# Patient Record
Sex: Female | Born: 1975 | Race: White | Hispanic: No | Marital: Married | Smoking: Never smoker
Health system: Southern US, Community
[De-identification: ages and names within clinical notes are randomized; demographics above are authoritative.]

## PROBLEM LIST (undated history)

## (undated) DIAGNOSIS — F419 Anxiety disorder, unspecified: Secondary | ICD-10-CM

## (undated) DIAGNOSIS — J45909 Unspecified asthma, uncomplicated: Secondary | ICD-10-CM

## (undated) DIAGNOSIS — I10 Essential (primary) hypertension: Secondary | ICD-10-CM

## (undated) DIAGNOSIS — G43909 Migraine, unspecified, not intractable, without status migrainosus: Secondary | ICD-10-CM

## (undated) HISTORY — DX: Essential (primary) hypertension: I10

## (undated) HISTORY — PX: WISDOM TOOTH EXTRACTION: SHX21

## (undated) HISTORY — DX: Anxiety disorder, unspecified: F41.9

---

## 2008-06-06 ENCOUNTER — Ambulatory Visit: Payer: Self-pay | Admitting: Family Medicine

## 2008-06-06 DIAGNOSIS — R519 Headache, unspecified: Secondary | ICD-10-CM | POA: Insufficient documentation

## 2008-06-06 DIAGNOSIS — R51 Headache: Secondary | ICD-10-CM

## 2008-06-06 DIAGNOSIS — J209 Acute bronchitis, unspecified: Secondary | ICD-10-CM | POA: Insufficient documentation

## 2008-06-06 DIAGNOSIS — F411 Generalized anxiety disorder: Secondary | ICD-10-CM | POA: Insufficient documentation

## 2008-06-06 DIAGNOSIS — J45909 Unspecified asthma, uncomplicated: Secondary | ICD-10-CM | POA: Insufficient documentation

## 2008-06-06 DIAGNOSIS — J309 Allergic rhinitis, unspecified: Secondary | ICD-10-CM | POA: Insufficient documentation

## 2010-03-12 ENCOUNTER — Ambulatory Visit (INDEPENDENT_AMBULATORY_CARE_PROVIDER_SITE_OTHER): Payer: BC Managed Care – PPO | Admitting: Emergency Medicine

## 2010-03-12 ENCOUNTER — Encounter: Payer: Self-pay | Admitting: Emergency Medicine

## 2010-03-12 DIAGNOSIS — J209 Acute bronchitis, unspecified: Secondary | ICD-10-CM

## 2010-03-12 DIAGNOSIS — J45909 Unspecified asthma, uncomplicated: Secondary | ICD-10-CM

## 2010-03-12 DIAGNOSIS — Z803 Family history of malignant neoplasm of breast: Secondary | ICD-10-CM

## 2010-03-18 ENCOUNTER — Telehealth (INDEPENDENT_AMBULATORY_CARE_PROVIDER_SITE_OTHER): Payer: Self-pay | Admitting: *Deleted

## 2010-03-19 NOTE — Assessment & Plan Note (Signed)
Summary: COUGHING/CHEST CONGESTION rm 4   Vital Signs:  Patient Profile:   35 Years Old Female CC:      cough, SOB Height:     63 inches O2 Sat:      100 % O2 treatment:    Room Air Temp:     99.1 degrees F oral Pulse rate:   99 / minute Resp:     24 per minute BP sitting:   125 / 76  (left arm) Cuff size:   regular  Vitals Entered By: Clemens Catholic LPN (March 11, 8117 4:52 PM)                  Updated Prior Medication List: PROAIR HFA 108 (90 BASE) MCG/ACT AERS (ALBUTEROL SULFATE) Two inhalations q4-6hr as needed.  Max 12 puffs/day  Current Allergies (reviewed today): ! VICODINHistory of Present Illness History from: patient Chief Complaint: cough, SOB History of Present Illness: 35 Years Old Female complains of onset of cold symptoms for 4 days.  BALINDA has been using Albuterol, Robitussin, Hydrocodone cough syrup which is helping a little bit. No sore throat + cough No pleuritic pain + wheezing (asthma) No nasal congestion + post-nasal drainage No sinus pain/pressure + chest congestion No itchy/red eyes No earache No hemoptysis + SOB (asthma) No chills/sweats No fever No nausea No vomiting No abdominal pain No diarrhea No skin rashes No fatigue No myalgias No headache   REVIEW OF SYSTEMS Constitutional Symptoms      Denies fever, chills, night sweats, weight loss, weight gain, and fatigue.  Eyes       Denies change in vision, eye pain, eye discharge, glasses, contact lenses, and eye surgery. Ear/Nose/Throat/Mouth       Complains of sore throat and hoarseness.      Denies hearing loss/aids, change in hearing, ear pain, ear discharge, dizziness, frequent runny nose, frequent nose bleeds, sinus problems, and tooth pain or bleeding.  Respiratory       Complains of dry cough, wheezing, shortness of breath, and asthma.      Denies productive cough, bronchitis, and emphysema/COPD.  Cardiovascular       Denies murmurs, chest pain, and tires easily with  exhertion.    Gastrointestinal       Denies stomach pain, nausea/vomiting, diarrhea, constipation, blood in bowel movements, and indigestion. Genitourniary       Denies painful urination, kidney stones, and loss of urinary control. Neurological       Complains of headaches.      Denies paralysis, seizures, and fainting/blackouts. Musculoskeletal       Denies muscle pain, joint pain, joint stiffness, decreased range of motion, redness, swelling, muscle weakness, and gout.  Skin       Denies bruising, unusual mles/lumps or sores, and hair/skin or nail changes.  Psych       Denies mood changes, temper/anger issues, anxiety/stress, speech problems, depression, and sleep problems. Other Comments: pt c/o dry cough, SOB, x friday. she has a hx of asthma. she has taken hydrocodone cough syrup, Tussin cold and cough, Sudafed, and Albuterol inh.    Past History:  Past Medical History: Reviewed history from 06/06/2008 and no changes required. Allergic rhinitis Anxiety Asthma -borderline Headache  Past Surgical History: wisdom teeth removed  Family History: Auto immune -  Family History Breast cancer 1st degree relative <50 Family History Diabetes 1st degree relative Family History Ovarian cancer heart dz  Social History: Reviewed history from 06/06/2008 and no changes required. Married Never  Smoked Alcohol use-yes, rarely Drug use-no Regular exercise-yes Physical Exam General appearance: well developed, well nourished, mild distress Ears: normal, no lesions or deformities Nasal: mucosa pink, nonedematous, no septal deviation, turbinates normal Oral/Pharynx: tongue normal, posterior pharynx without erythema or exudate Chest/Lungs: scattered wheezes throughout all lobes (no wheezes s/p neb treatment) Heart: regular rate and  rhythm, no murmur MSE: oriented to time, place, and person Assessment New Problems: BRONCHITIS, ACUTE WITH MILD BRONCHOSPASM (ICD-466.0)   Plan New  Medications/Changes: VENTOLIN HFA 108 (90 BASE) MCG/ACT AERS (ALBUTEROL SULFATE) 1-2 puffs q6 hrs as needed  #1 x 3, 03/12/2010, Hoyt Koch MD PREDNISONE (PAK) 10 MG TABS (PREDNISONE) 6 day pack, use as directed  #1 x 0, 03/12/2010, Hoyt Koch MD  New Orders: Est. Patient Level IV [16109] Pulse Oximetry (single measurment) [94760] Nebulizer Tx [94640] Ipratropium inhalation sol. unit dose [U0454] Albuterol Sulfate Sol 1mg  unit dose [J7613] Solumedrol up to 125mg  [J2930] Admin of Therapeutic Inj  intramuscular or subcutaneous [96372] Planning Comments:   1)  No antibiotic since likely viral bronchitis.  If not improved in a few days, patient to call back and we will call in Zpak. 2)  Use nasal saline solution (over the counter) at least 3 times a day. 3)  Use over the counter decongestants like Zyrtec-D every 12 hours as needed to help with congestion.  Can use your cough syrup at night, but likely won't help much since coughing is from bronchospasm. 4)  Can take tylenol every 6 hours or motrin every 8 hours for pain or fever. 5)  Follow up with your primary doctor  if no improvement in 5-7 days, sooner if increasing pain, fever, or new symptoms.    The patient and/or caregiver has been counseled thoroughly with regard to medications prescribed including dosage, schedule, interactions, rationale for use, and possible side effects and they verbalize understanding.  Diagnoses and expected course of recovery discussed and will return if not improved as expected or if the condition worsens. Patient and/or caregiver verbalized understanding.  Prescriptions: VENTOLIN HFA 108 (90 BASE) MCG/ACT AERS (ALBUTEROL SULFATE) 1-2 puffs q6 hrs as needed  #1 x 3   Entered and Authorized by:   Hoyt Koch MD   Signed by:   Hoyt Koch MD on 03/12/2010   Method used:   Print then Give to Patient   RxID:   [id number] PREDNISONE (PAK) 10 MG TABS (PREDNISONE) 6 day pack, use as  directed  #1 x 0   Entered and Authorized by:   Hoyt Koch MD   Signed by:   Hoyt Koch MD on 03/12/2010   Method used:   Print then Give to Patient   RxID:   [id number]   Medication Administration  Injection # 1:    Medication: Solumedrol up to 125mg     Diagnosis: BRONCHITIS, ACUTE WITH MILD BRONCHOSPASM (ICD-466.0)    Route: IM    Site: RUOQ gluteus    Exp Date: 07/06/2012    Lot #: [id number]    Mfr: Pharmacia    Patient tolerated injection without complications    Given by: Clemens Catholic LPN (March 12, 4130 5:30 PM)  Medication # 1:    Medication: Albuterol Sulfate Sol 1mg  unit dose    Diagnosis: BRONCHITIS, ACUTE WITH MILD BRONCHOSPASM (ICD-466.0)    Dose: 3.0mg      Route: inhaled    Exp Date: 11/07/2010    Lot #: [id number]    Mfr: mylan    Patient tolerated medication without complications  Given by: Clemens Catholic LPN (March 11, 9145 5:07 PM)  Medication # 2:    Medication: Ipratropium inhalation sol. unit dose    Diagnosis: BRONCHITIS, ACUTE WITH MILD BRONCHOSPASM (ICD-466.0)    Dose: 0.5mg     Route: inhaled    Exp Date: 11/07/2010    Lot #: [id number]    Mfr: mylan    Patient tolerated medication without complications    Given by: Clemens Catholic LPN (March 11, 8293 5:08 PM)  Orders Added: 1)  Est. Patient Level IV [62130] 2)  Pulse Oximetry (single measurment) [94760] 3)  Nebulizer Tx [86578] 4)  Ipratropium inhalation sol. unit dose [J7644] 5)  Albuterol Sulfate Sol 1mg  unit dose [J7613] 6)  Solumedrol up to 125mg  [J2930] 7)  Admin of Therapeutic Inj  intramuscular or subcutaneous [46962]

## 2010-03-26 NOTE — Progress Notes (Signed)
  Phone Note Outgoing Call Call back at [facility]   Summary of Call: Patient called reporting she is feeling wose. Per note z-pak called in to [facility] in [place]. Initial call taken by: Lajean Saver RN,  March 18, 2010 8:30 AM

## 2010-04-24 ENCOUNTER — Other Ambulatory Visit: Payer: Self-pay | Admitting: Specialist

## 2010-04-24 DIAGNOSIS — N644 Mastodynia: Secondary | ICD-10-CM

## 2011-01-08 ENCOUNTER — Other Ambulatory Visit: Payer: Self-pay | Admitting: Family Medicine

## 2011-01-08 ENCOUNTER — Ambulatory Visit
Admission: RE | Admit: 2011-01-08 | Discharge: 2011-01-08 | Disposition: A | Discharge status: Home / Self Care | Payer: BC Managed Care – PPO | Source: Ambulatory Visit | Attending: Family Medicine | Admitting: Family Medicine

## 2011-01-08 DIAGNOSIS — R05 Cough: Secondary | ICD-10-CM

## 2011-01-08 DIAGNOSIS — R0602 Shortness of breath: Secondary | ICD-10-CM

## 2012-01-07 ENCOUNTER — Encounter: Payer: Self-pay | Admitting: *Deleted

## 2012-01-07 ENCOUNTER — Emergency Department
Admission: EM | Admit: 2012-01-07 | Discharge: 2012-01-07 | Disposition: A | Discharge status: Home / Self Care | Payer: BC Managed Care – PPO | Source: Home / Self Care | Attending: Family Medicine | Admitting: Family Medicine

## 2012-01-07 DIAGNOSIS — J069 Acute upper respiratory infection, unspecified: Secondary | ICD-10-CM

## 2012-01-07 HISTORY — DX: Migraine, unspecified, not intractable, without status migrainosus: G43.909

## 2012-01-07 HISTORY — DX: Unspecified asthma, uncomplicated: J45.909

## 2012-01-07 MED ORDER — PREDNISONE 20 MG PO TABS: 20.0000 mg | ORAL_TABLET | Freq: Two times a day (BID) | ORAL | Status: DC

## 2012-01-07 MED ORDER — ALBUTEROL SULFATE HFA 108 (90 BASE) MCG/ACT IN AERS: 2.0000 | INHALATION_SPRAY | Freq: Four times a day (QID) | RESPIRATORY_TRACT | Status: AC | PRN

## 2012-01-07 MED ORDER — AZITHROMYCIN 250 MG PO TABS: ORAL_TABLET | ORAL | Status: DC

## 2012-01-07 MED ORDER — BENZONATATE 200 MG PO CAPS: 200.0000 mg | ORAL_CAPSULE | Freq: Every day | ORAL | Status: DC

## 2012-01-07 NOTE — ED Provider Notes (Signed)
History     CSN: [id number]  Arrival date & time 01/07/12  1342   First MD Initiated Contact with Patient 01/07/12 1403      Chief Complaint  Patient presents with  . Cough      HPI Comments: Patient complains of dry cough, runny nose, and dry throat for 5 days. denies fever.  She has had wheezing improved with her Ventolin inhaler.  The history is provided by the patient.    Past Medical History  Diagnosis Date  . Asthma   . Migraine     Past Surgical History  Procedure Date  . Wisdom tooth extraction     Family History  Problem Relation Age of Onset  . Cancer Mother     breast  . Cancer Other     ovarian    History  Substance Use Topics  . Smoking status: Never Smoker   . Smokeless tobacco: Not on file  . Alcohol Use: No    OB History    Grav Para Term Preterm Abortions TAB SAB Ect Mult Living                  Review of Systems + sore throat + cough No pleuritic pain + wheezing + nasal congestion + post-nasal drainage No sinus pain/pressure No itchy/red eyes ? earache No hemoptysis + SOB No fever/chills No nausea No vomiting No abdominal pain No diarrhea No urinary symptoms No skin rashes + fatigue No myalgias No headache Used OTC meds without relief  Allergies  Hydrocodone-acetaminophen  Home Medications   Current Outpatient Rx  Name  Route  Sig  Dispense  Refill  . HYDROCODONE-ACETAMINOPHEN 5-500 MG PO TABS   Oral   Take 1 tablet by mouth every 6 (six) hours as needed.         . ALBUTEROL SULFATE HFA 108 (90 BASE) MCG/ACT IN AERS   Inhalation   Inhale 2 puffs into the lungs every 6 (six) hours as needed for wheezing.   1 Inhaler   1   . AZITHROMYCIN 250 MG PO TABS      Take 2 tabs today; then begin one tab once daily for 4 more days. (Rx void after 01/14/12)   6 each   0   . BENZONATATE 200 MG PO CAPS   Oral   Take 1 capsule (200 mg total) by mouth at bedtime. Take as needed for cough   12 capsule   0   .  PREDNISONE 20 MG PO TABS   Oral   Take 1 tablet (20 mg total) by mouth 2 (two) times daily.   10 tablet   0     BP 142/87  Pulse 92  Temp 97.8 F (36.6 C) (Oral)  Resp 16  Ht 5\' 3"  (1.6 m)  Wt 188 lb (85.276 kg)  BMI 33.30 kg/m2  SpO2 98%  LMP 12/29/2011  Physical Exam Nursing notes and Vital Signs reviewed. Appearance:  Patient appears stated age, and in no acute distress.  Patient is obese (BMI 33.3) Eyes:  Pupils are equal, round, and reactive to light and accomodation.  Extraocular movement is intact.  Conjunctivae are not inflamed  Ears:  Canals normal.  Tympanic membranes normal.  Nose:  Mildly congested turbinates.  No sinus tenderness.    Pharynx:  Normal Neck:  Supple.  Slightly tender shotty posterior nodes are palpated bilaterally  Lungs:   Few scattered faint wheezes posteriorly.  Breath sounds are equal.  Heart:  Regular  rate and rhythm without murmurs, rubs, or gallops.  Abdomen:  Nontender without masses or hepatosplenomegaly.  Bowel sounds are present.  No CVA or flank tenderness.  Extremities:  No edema.  No calf tenderness Skin:  No rash present.   ED Course  Procedures none      1. Acute upper respiratory infections of unspecified site       MDM  There is no evidence of bacterial infection today.   Begin prednisone burst.  Prescription written for Benzonatate (Tessalon) to take at bedtime for night-time cough.   Continue albuterol inhaler. Take plain Mucinex (guaifenesin) twice daily (or other guaifenesin product) for cough and congestion. May add Sudafed as needed  Increase fluid intake, rest. May use Afrin nasal spray (or generic oxymetazoline) twice daily for about 5 days.  Also recommend using saline nasal spray several times daily and saline nasal irrigation (AYR is a common brand) Stop all antihistamines for now, and other non-prescription cough/cold preparations. Begin Azithromycin if not improving about 5 days or if persistent fever  develops (Given a prescription to hold, with an expiration date)  Follow-up with family doctor if not improving 7 to 10 days.        Lattie Haw, MD 01/12/12 [phone]

## 2012-01-07 NOTE — ED Notes (Signed)
Pt c/o dry cough, runny nose, and dry throat x 5 days. denies fever. She has taken benadryl, Robt, and Sudafed.

## 2013-06-07 ENCOUNTER — Other Ambulatory Visit: Payer: Self-pay | Admitting: *Deleted

## 2013-06-07 ENCOUNTER — Ambulatory Visit (INDEPENDENT_AMBULATORY_CARE_PROVIDER_SITE_OTHER): Payer: BC Managed Care – PPO

## 2013-06-07 DIAGNOSIS — R29898 Other symptoms and signs involving the musculoskeletal system: Secondary | ICD-10-CM

## 2013-06-07 DIAGNOSIS — R52 Pain, unspecified: Secondary | ICD-10-CM

## 2013-06-07 DIAGNOSIS — R9389 Abnormal findings on diagnostic imaging of other specified body structures: Secondary | ICD-10-CM

## 2013-06-07 DIAGNOSIS — M25569 Pain in unspecified knee: Secondary | ICD-10-CM

## 2017-05-25 ENCOUNTER — Other Ambulatory Visit: Payer: Self-pay | Admitting: Family Medicine

## 2017-05-25 DIAGNOSIS — Z1231 Encounter for screening mammogram for malignant neoplasm of breast: Secondary | ICD-10-CM

## 2017-06-12 ENCOUNTER — Ambulatory Visit (INDEPENDENT_AMBULATORY_CARE_PROVIDER_SITE_OTHER): Payer: BLUE CROSS/BLUE SHIELD

## 2017-06-12 DIAGNOSIS — Z1231 Encounter for screening mammogram for malignant neoplasm of breast: Secondary | ICD-10-CM

## 2018-01-14 ENCOUNTER — Other Ambulatory Visit: Payer: Self-pay | Admitting: Family Medicine

## 2018-01-14 DIAGNOSIS — R140 Abdominal distension (gaseous): Secondary | ICD-10-CM

## 2018-02-01 ENCOUNTER — Emergency Department (INDEPENDENT_AMBULATORY_CARE_PROVIDER_SITE_OTHER): Payer: BLUE CROSS/BLUE SHIELD

## 2018-02-01 ENCOUNTER — Telehealth: Payer: Self-pay | Admitting: Family Medicine

## 2018-02-01 ENCOUNTER — Encounter: Payer: Self-pay | Admitting: *Deleted

## 2018-02-01 ENCOUNTER — Emergency Department
Admission: EM | Admit: 2018-02-01 | Discharge: 2018-02-01 | Disposition: A | Discharge status: Home / Self Care | Payer: BLUE CROSS/BLUE SHIELD | Source: Home / Self Care | Attending: Emergency Medicine | Admitting: Emergency Medicine

## 2018-02-01 DIAGNOSIS — R079 Chest pain, unspecified: Secondary | ICD-10-CM

## 2018-02-01 DIAGNOSIS — R05 Cough: Secondary | ICD-10-CM

## 2018-02-01 DIAGNOSIS — R059 Cough, unspecified: Secondary | ICD-10-CM

## 2018-02-01 DIAGNOSIS — R071 Chest pain on breathing: Secondary | ICD-10-CM

## 2018-02-01 LAB — POCT INFLUENZA A/B
Influenza A, POC: NEGATIVE
Influenza B, POC: NEGATIVE

## 2018-02-01 MED ORDER — ONDANSETRON 4 MG PO TBDP: 4.0000 mg | ORAL_TABLET | Freq: Once | ORAL | Status: DC

## 2018-02-01 MED ORDER — BENZONATATE 200 MG PO CAPS: ORAL_CAPSULE | ORAL | 0 refills | Status: DC

## 2018-02-01 MED ORDER — KETOROLAC TROMETHAMINE 60 MG/2ML IM SOLN: 60.0000 mg | Freq: Once | INTRAMUSCULAR | Status: DC

## 2018-02-01 NOTE — ED Provider Notes (Signed)
[facility]    CSN: [id number] Arrival date & time: 02/01/18  0915     History   Chief Complaint Chief Complaint  Patient presents with  . Cough  . Chest Pain    HPI Autumn Ortiz is a 43 y.o. female.   HPI Patient states she was in her usual state of health until Friday.  She had myalgias at that time.  She took that day and used bleach to clean 2 bathrooms.  She did feel like she may have inhaled some of the bathroom cleaner.  She did not mix any chemicals and used only a single bottle of bathroom cleaner.  She became worse over the weekend with rhinorrhea and worsening cough.  She is having aching with chest discomfort with cough.  She has had a productive cough but her phlegm has been clear.  Her husband is currently battling a upper respiratory infection.  She has had a flu shot. Past Medical History:  Diagnosis Date  . Asthma   . Migraine     Patient Active Problem List   Diagnosis Date Noted  . ANXIETY 06/06/2008  . ACUTE BRONCHITIS 06/06/2008  . ALLERGIC RHINITIS 06/06/2008  . ASTHMA 06/06/2008  . HEADACHE 06/06/2008    Past Surgical History:  Procedure Laterality Date  . WISDOM TOOTH EXTRACTION      OB History   No obstetric history on file.      Home Medications    Prior to Admission medications   Medication Sig Start Date End Date Taking? Authorizing Provider  albuterol (PROVENTIL HFA;VENTOLIN HFA) 108 (90 BASE) MCG/ACT inhaler Inhale 2 puffs into the lungs every 6 (six) hours as needed for wheezing. 01/07/12  Yes Lattie HawBeese, Stephen A, MD  azithromycin (ZITHROMAX Z-PAK) 250 MG tablet Take 2 tabs today; then begin one tab once daily for 4 more days. (Rx void after 01/14/12) 01/07/12   Lattie HawBeese, Stephen A, MD  benzonatate (TESSALON) 200 MG capsule Take 1 capsule (200 mg total) by mouth at bedtime. Take as needed for cough 01/07/12   Lattie HawBeese, Stephen A, MD  HYDROcodone-acetaminophen (VICODIN) 5-500 MG per tablet Take 1 tablet by mouth every 6 (six) hours  as needed.    [provider]  predniSONE (DELTASONE) 20 MG tablet Take 1 tablet (20 mg total) by mouth 2 (two) times daily. 01/07/12   Lattie HawBeese, Stephen A, MD    Family History Family History  Problem Relation Age of Onset  . Cancer Other        ovarian  . Cancer Mother        breast  . Breast cancer Mother        died age 44  . Healthy Father     Social History Social History   Tobacco Use  . Smoking status: Never Smoker  . Smokeless tobacco: Never Used  Substance Use Topics  . Alcohol use: No  . Drug use: No     Allergies   Hydrocodone-acetaminophen   Review of Systems Review of Systems  Constitutional: Negative for fever.  HENT: Positive for congestion.   Eyes: Negative.   Respiratory: Positive for cough and shortness of breath.   Cardiovascular: Positive for chest pain.  Gastrointestinal: Negative.   Musculoskeletal: Negative.      Physical Exam Triage Vital Signs ED Triage Vitals  Enc Vitals Group     BP 02/01/18 0952 138/86     Pulse Rate 02/01/18 0952 91     Resp 02/01/18 0952 16  Temp 02/01/18 0952 98 F (36.7 C)     Temp Source 02/01/18 0952 Oral     SpO2 02/01/18 0952 98 %     Weight 02/01/18 0953 189 lb (85.7 kg)     Height 02/01/18 0953 5\' 8"  (1.727 m)     Head Circumference --      Peak Flow --      Pain Score 02/01/18 0953 0     Pain Loc --      Pain Edu? --      Excl. in GC? --    No data found.  Updated Vital Signs BP 138/86 (BP Location: Right Arm)   Pulse 91   Temp 98 F (36.7 C) (Oral)   Resp 16   Ht 5\' 8"  (1.727 m)   Wt 85.7 kg   SpO2 98%   BMI 28.74 kg/m   Visual Acuity Right Eye Distance:   Left Eye Distance:   Bilateral Distance:    Right Eye Near:   Left Eye Near:    Bilateral Near:     Physical Exam Constitutional:      Appearance: She is well-developed.  HENT:     Head: Normocephalic and atraumatic.  Eyes:     Pupils: Pupils are equal, round, and reactive to light.  Neck:      Musculoskeletal: Normal range of motion.  Cardiovascular:     Rate and Rhythm: Normal rate and regular rhythm.     Heart sounds: Normal heart sounds.  Pulmonary:     Comments: Chest expansion normal.  Breath sounds are symmetrical.  There are no rales or rhonchi heard. Chest:     Comments: There is mild discomfort over the costal sternal junctions anterior chest wall. Neurological:     Mental Status: She is alert.      UC Treatments / Results  Labs (all labs ordered are listed, but only abnormal results are displayed) Labs Reviewed  POCT INFLUENZA A/B    EKG None  Radiology Dg Chest 2 View  Result Date: 02/01/2018 CLINICAL DATA:  Productive cough, chest tightness. EXAM: CHEST - 2 VIEW COMPARISON:  Radiographs of January 08, 2011. FINDINGS: The heart size and mediastinal contours are within normal limits. Both lungs are clear. No pneumothorax or pleural effusion is noted. The visualized skeletal structures are unremarkable. IMPRESSION: No active cardiopulmonary disease. Electronically Signed   By: Lupita RaiderJames  Green Jr, M.D.   On: 02/01/2018 10:33    Procedures Procedures (including critical care time)  Medications Ordered in UC Medications - No data to display  Initial Impression / Assessment and Plan / UC Course  I have reviewed the triage vital signs and the nursing notes.  Pertinent labs & imaging results that were available during my care of the patient were reviewed by me and considered in my medical decision making (see chart for details). Will check chest x-ray and flu swab.  She does have an allergy to hydrocodone so will not be able to take any narcotic cough medications.  If chest x-ray is normal and negative flu swab will treat with Tessalon Perles and advised Delsym. Flu tests are negative, chest x-ray shows no infiltrates.  Will treat symptomatically.     Final Clinical Impressions(s) / UC Diagnoses   Final diagnoses:  Cough  Chest pain on breathing      Discharge Instructions     Be sure and drink good quantities of fluids. Use Tessalon Perles for your cough. Try Delsym twice a day.  ED Prescriptions    None     Controlled Substance Prescriptions [place] Controlled Substance Registry consulted? Not Applicable   Collene Gobble, MD 02/01/18 1109

## 2018-02-01 NOTE — Discharge Instructions (Signed)
Be sure and drink good quantities of fluids. Use Tessalon Perles for your cough. Try Delsym twice a day.

## 2018-02-01 NOTE — Telephone Encounter (Signed)
Patient did not receive her prescription for Tessalon.  Rx written. Recommend that she take plain Mucinex daytime to loosen cough.

## 2018-02-01 NOTE — ED Triage Notes (Signed)
Productive cough- white, and chest pain with coughing. Denies fever. Husband recent URI.

## 2018-02-11 ENCOUNTER — Ambulatory Visit (INDEPENDENT_AMBULATORY_CARE_PROVIDER_SITE_OTHER): Payer: BLUE CROSS/BLUE SHIELD

## 2018-02-11 DIAGNOSIS — R140 Abdominal distension (gaseous): Secondary | ICD-10-CM | POA: Diagnosis not present

## 2018-02-12 ENCOUNTER — Other Ambulatory Visit: Payer: BLUE CROSS/BLUE SHIELD

## 2018-10-25 ENCOUNTER — Other Ambulatory Visit: Payer: Self-pay | Admitting: Obstetrics & Gynecology

## 2018-10-25 DIAGNOSIS — Z1231 Encounter for screening mammogram for malignant neoplasm of breast: Secondary | ICD-10-CM

## 2018-10-28 ENCOUNTER — Other Ambulatory Visit: Payer: Self-pay

## 2018-10-28 ENCOUNTER — Ambulatory Visit (INDEPENDENT_AMBULATORY_CARE_PROVIDER_SITE_OTHER): Payer: BC Managed Care – PPO

## 2018-10-28 ENCOUNTER — Other Ambulatory Visit: Payer: Self-pay | Admitting: Family Medicine

## 2018-10-28 DIAGNOSIS — M533 Sacrococcygeal disorders, not elsewhere classified: Secondary | ICD-10-CM | POA: Diagnosis not present

## 2018-10-28 DIAGNOSIS — Z1231 Encounter for screening mammogram for malignant neoplasm of breast: Secondary | ICD-10-CM

## 2018-10-28 DIAGNOSIS — G8929 Other chronic pain: Secondary | ICD-10-CM

## 2018-12-21 ENCOUNTER — Other Ambulatory Visit: Payer: Self-pay | Admitting: Physician Assistant

## 2018-12-21 ENCOUNTER — Ambulatory Visit (INDEPENDENT_AMBULATORY_CARE_PROVIDER_SITE_OTHER): Payer: BC Managed Care – PPO

## 2018-12-21 ENCOUNTER — Other Ambulatory Visit: Payer: Self-pay

## 2018-12-21 DIAGNOSIS — M542 Cervicalgia: Secondary | ICD-10-CM

## 2018-12-21 DIAGNOSIS — M545 Low back pain, unspecified: Secondary | ICD-10-CM

## 2018-12-21 DIAGNOSIS — G8929 Other chronic pain: Secondary | ICD-10-CM | POA: Diagnosis not present

## 2019-04-25 ENCOUNTER — Emergency Department
Admission: EM | Admit: 2019-04-25 | Discharge: 2019-04-25 | Disposition: A | Discharge status: Home / Self Care | Payer: BC Managed Care – PPO | Source: Home / Self Care | Attending: Family Medicine | Admitting: Family Medicine

## 2019-04-25 ENCOUNTER — Other Ambulatory Visit: Payer: Self-pay

## 2019-04-25 DIAGNOSIS — N3000 Acute cystitis without hematuria: Secondary | ICD-10-CM

## 2019-04-25 DIAGNOSIS — R300 Dysuria: Secondary | ICD-10-CM

## 2019-04-25 LAB — POCT URINALYSIS DIP (MANUAL ENTRY)
Bilirubin, UA: NEGATIVE
Glucose, UA: NEGATIVE mg/dL
Ketones, POC UA: NEGATIVE mg/dL
Nitrite, UA: POSITIVE — AB
Protein Ur, POC: NEGATIVE mg/dL
Spec Grav, UA: 1.015 (ref 1.010–1.025)
Urobilinogen, UA: 0.2 E.U./dL
pH, UA: 5.5 (ref 5.0–8.0)

## 2019-04-25 MED ORDER — CEPHALEXIN 500 MG PO CAPS: ORAL_CAPSULE | ORAL | 0 refills | Status: DC

## 2019-04-25 NOTE — Discharge Instructions (Addendum)
Increase fluid intake. May use non-prescription AZO for about two days, if desired, to decrease urinary discomfort.  If symptoms become significantly worse during the night or over the weekend, proceed to the local emergency room.  

## 2019-04-25 NOTE — ED Triage Notes (Signed)
Patient presents to Urgent Care with complaints of lower abdominal pain while urinating since four days ago. Patient reports yesterday it got much worse despite taking otc azo. Pt has also had urinary frequency and has been increasing fluid intake.  Pt is sexually active, states monogamous relationship.

## 2019-04-25 NOTE — ED Provider Notes (Signed)
[facility]    CSN: [id number] Arrival date & time: 04/25/19  1724      History   Chief Complaint Chief Complaint  Patient presents with  . Urinary Tract Infection    HPI Autumn Ortiz is a 44 y.o. female.   Six days ago patient developed dysuria and lower abdominal pressure.  Yesterday she developed increased pain with urgency and hesitancy.  No LMP recorded. (Menstrual status: Oral contraceptives).  She denies pelvic pain and vaginal discharge.  She is sexually active and in a monogamous relationship.  The history is provided by the patient.  Dysuria Pain quality:  Burning Pain severity:  Moderate Onset quality:  Sudden Duration:  6 days Timing:  Constant Progression:  Worsening Chronicity:  New Recent urinary tract infections: no   Relieved by:  Nothing Worsened by:  Nothing Ineffective treatments:  Phenazopyridine Urinary symptoms: frequent urination and hesitancy   Urinary symptoms: no discolored urine, no foul-smelling urine, no hematuria and no bladder incontinence   Associated symptoms: no abdominal pain, no fever, no flank pain, no genital lesions, no nausea and no vaginal discharge   Risk factors: sexually active     Past Medical History:  Diagnosis Date  . Asthma   . Migraine     Patient Active Problem List   Diagnosis Date Noted  . ANXIETY 06/06/2008  . ACUTE BRONCHITIS 06/06/2008  . ALLERGIC RHINITIS 06/06/2008  . ASTHMA 06/06/2008  . HEADACHE 06/06/2008    Past Surgical History:  Procedure Laterality Date  . WISDOM TOOTH EXTRACTION      OB History   No obstetric history on file.      Home Medications    Prior to Admission medications   Medication Sig Start Date End Date Taking? Authorizing Provider  albuterol (PROVENTIL HFA;VENTOLIN HFA) 108 (90 BASE) MCG/ACT inhaler Inhale 2 puffs into the lungs every 6 (six) hours as needed for wheezing. 01/07/12  Yes Lattie Haw, MD  ALPRAZolam Prudy Feeler) 0.25 MG tablet Take 0.25  mg by mouth at bedtime as needed for anxiety.   Yes [provider]  lisinopril (ZESTRIL) 5 MG tablet Take 10 mg by mouth daily.   Yes [provider]  norethindrone-ethinyl estradiol (LOESTRIN FE) 1-20 MG-MCG tablet Take 1 tablet by mouth daily.   Yes [provider]  rizatriptan (MAXALT) 10 MG tablet Take 10 mg by mouth as needed for migraine. May repeat in 2 hours if needed   Yes [provider]  topiramate (TOPAMAX) 50 MG tablet Take 75 mg by mouth 2 (two) times daily.   Yes [provider]  azithromycin (ZITHROMAX Z-PAK) 250 MG tablet Take 2 tabs today; then begin one tab once daily for 4 more days. (Rx void after 01/14/12) 01/07/12   Lattie Haw, MD  benzonatate (TESSALON) 200 MG capsule Take one cap by mouth at bedtime as needed for cough.  May repeat in 4 to 6 hours 02/01/18   Lattie Haw, MD  cephALEXin [place]) 500 MG capsule Take one cap PO BID 04/25/19   Lattie Haw, MD  HYDROcodone-acetaminophen (VICODIN) 5-500 MG per tablet Take 1 tablet by mouth every 6 (six) hours as needed.    [provider]  predniSONE (DELTASONE) 20 MG tablet Take 1 tablet (20 mg total) by mouth 2 (two) times daily. 01/07/12   Lattie Haw, MD    Family History Family History  Problem Relation Age of Onset  . Cancer Other  ovarian  . Cancer Mother        breast  . Breast cancer Mother        died age 47  . Healthy Father     Social History Social History   Tobacco Use  . Smoking status: Never Smoker  . Smokeless tobacco: Never Used  Substance Use Topics  . Alcohol use: No  . Drug use: No     Allergies   Hydrocodone-acetaminophen   Review of Systems Review of Systems  Constitutional: Negative for chills, diaphoresis, fatigue and fever.  Gastrointestinal: Negative for abdominal pain and nausea.  Genitourinary: Positive for dysuria and urgency. Negative for flank pain, hematuria, pelvic pain and vaginal discharge.  All  other systems reviewed and are negative.    Physical Exam Triage Vital Signs ED Triage Vitals  Enc Vitals Group     BP 04/25/19 1752 127/80     Pulse Rate 04/25/19 1752 93     Resp 04/25/19 1752 16     Temp 04/25/19 1752 97.9 F (36.6 C)     Temp Source 04/25/19 1752 Oral     SpO2 04/25/19 1752 98 %     Weight --      Height --      Head Circumference --      Peak Flow --      Pain Score 04/25/19 1746 2     Pain Loc --      Pain Edu? --      Excl. in GC? --    No data found.  Updated Vital Signs BP 127/80 (BP Location: Right Arm)   Pulse 93   Temp 97.9 F (36.6 C) (Oral)   Resp 16   SpO2 98%   Visual Acuity Right Eye Distance:   Left Eye Distance:   Bilateral Distance:    Right Eye Near:   Left Eye Near:    Bilateral Near:     Physical Exam Nursing notes and Vital Signs reviewed. Appearance:  Patient appears stated age, and in no acute distress.    Eyes:  Pupils are equal, round, and reactive to light and accomodation.  Extraocular movement is intact.  Conjunctivae are not inflamed   Pharynx:  Normal; moist mucous membranes  Neck:  Supple.  No adenopathy Lungs:  Clear to auscultation.  Breath sounds are equal.  Moving air well. Heart:  Regular rate and rhythm without murmurs, rubs, or gallops.  Abdomen:  Nontender without masses or hepatosplenomegaly.  Bowel sounds are present.  No CVA or flank tenderness.  Extremities:  No edema.  Skin:  No rash present.     UC Treatments / Results  Labs (all labs ordered are listed, but only abnormal results are displayed) Labs Reviewed  POCT URINALYSIS DIP (MANUAL ENTRY) - Abnormal; Notable for the following components:      Result Value   Color, UA orange (*)    Blood, UA small (*)    Nitrite, UA Positive (*)    Leukocytes, UA Small (1+) (*)    All other components within normal limits  URINE CULTURE    EKG   Radiology No results found.  Procedures Procedures (including critical care time)  Medications  Ordered in UC Medications - No data to display  Initial Impression / Assessment and Plan / UC Course  I have reviewed the triage vital signs and the nursing notes.  Pertinent labs & imaging results that were available during my care of the patient were reviewed by me  and considered in my medical decision making (see chart for details).    Urine culture pending. Begin Keflex. Followup with Family Doctor if not improved in one week.    Final Clinical Impressions(s) / UC Diagnoses   Final diagnoses:  Dysuria  Acute cystitis without hematuria     Discharge Instructions     Increase fluid intake.  May use non-prescription AZO for about two days, if desired, to decrease urinary discomfort.   If symptoms become significantly worse during the night or over the weekend, proceed to the local emergency room.     ED Prescriptions    Medication Sig Dispense Auth. Provider   cephALEXin (KEFLEX) 500 MG capsule Take one cap PO BID 14 capsule Kandra Nicolas, MD        Kandra Nicolas, MD 04/27/19 2132

## 2019-04-26 LAB — URINE CULTURE
MICRO NUMBER:: 10379163
SPECIMEN QUALITY:: ADEQUATE

## 2019-10-12 ENCOUNTER — Emergency Department (INDEPENDENT_AMBULATORY_CARE_PROVIDER_SITE_OTHER)
Admission: EM | Admit: 2019-10-12 | Discharge: 2019-10-12 | Disposition: A | Discharge status: Home / Self Care | Payer: BC Managed Care – PPO | Source: Home / Self Care

## 2019-10-12 ENCOUNTER — Emergency Department (INDEPENDENT_AMBULATORY_CARE_PROVIDER_SITE_OTHER): Payer: BC Managed Care – PPO

## 2019-10-12 ENCOUNTER — Other Ambulatory Visit: Payer: Self-pay

## 2019-10-12 DIAGNOSIS — M545 Low back pain, unspecified: Secondary | ICD-10-CM

## 2019-10-12 DIAGNOSIS — M5416 Radiculopathy, lumbar region: Secondary | ICD-10-CM | POA: Diagnosis not present

## 2019-10-12 MED ORDER — KETOROLAC TROMETHAMINE 60 MG/2ML IM SOLN
60.0000 mg | Freq: Once | INTRAMUSCULAR | Status: AC
  Administered 2019-10-12: 60 mg via INTRAMUSCULAR

## 2019-10-12 MED ORDER — DEXAMETHASONE SODIUM PHOSPHATE 10 MG/ML IJ SOLN
10.0000 mg | Freq: Once | INTRAMUSCULAR | Status: AC
  Administered 2019-10-12: 10 mg via INTRAMUSCULAR

## 2019-10-12 MED ORDER — MELOXICAM 15 MG PO TABS: 15.0000 mg | ORAL_TABLET | Freq: Every day | ORAL | 1 refills | Status: DC

## 2019-10-12 MED ORDER — METHOCARBAMOL 500 MG PO TABS: 500.0000 mg | ORAL_TABLET | Freq: Two times a day (BID) | ORAL | 0 refills | Status: DC

## 2019-10-12 NOTE — ED Triage Notes (Signed)
Pt c/o lower central back pain since this morning. Was getting ready for work when she bent over to put shoes on and she felt a pinching shooting pain in her lower back radiating to the LT. 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 Pain 10/10 when moving.

## 2019-10-12 NOTE — Discharge Instructions (Addendum)
Go immediately to the ER if your symptoms do not improve within 2 hours.

## 2019-10-12 NOTE — ED Provider Notes (Addendum)
[facility]    CSN: [id number] Arrival date & time: 10/12/19  1635      History   Chief Complaint Chief Complaint  Patient presents with  . Back Pain    HPI Autumn Ortiz is a 44 y.o. female.   HPI  Patient with a history of degenerative disc disease present with acute onset of worsening low back pain.  Patient attempted to go to work today and with ambulation pain worsened to 10 out of 10.  Without movement patient is not experiencing severe pain.  Pain is present with lying flat and with any ambulation.  She denies any known injury.  She does recall bending over to put her shoes on and immediately experiencing a shooting sharp type pain that has not resolved.  She experienced sciatica-type pain in the past however today seems slightly worse.  Past Medical History:  Diagnosis Date  . Asthma   . Migraine     Patient Active Problem List   Diagnosis Date Noted  . ANXIETY 06/06/2008  . ACUTE BRONCHITIS 06/06/2008  . ALLERGIC RHINITIS 06/06/2008  . ASTHMA 06/06/2008  . HEADACHE 06/06/2008    Past Surgical History:  Procedure Laterality Date  . WISDOM TOOTH EXTRACTION      OB History   No obstetric history on file.      Home Medications    Prior to Admission medications   Medication Sig Start Date End Date Taking? Authorizing Provider  albuterol (PROVENTIL HFA;VENTOLIN HFA) 108 (90 BASE) MCG/ACT inhaler Inhale 2 puffs into the lungs every 6 (six) hours as needed for wheezing. 01/07/12   Lattie Haw, MD  ALPRAZolam Prudy Feeler) 0.25 MG tablet Take 0.25 mg by mouth at bedtime as needed for anxiety.    [provider]  azithromycin (ZITHROMAX Z-PAK) 250 MG tablet Take 2 tabs today; then begin one tab once daily for 4 more days. (Rx void after 01/14/12) 01/07/12   Lattie Haw, MD  benzonatate (TESSALON) 200 MG capsule Take one cap by mouth at bedtime as needed for cough.  May repeat in 4 to 6 hours 02/01/18   Lattie Haw, MD  cephALEXin  [facility]) 500 MG capsule Take one cap PO BID 04/25/19   Lattie Haw, MD  HYDROcodone-acetaminophen (VICODIN) 5-500 MG per tablet Take 1 tablet by mouth every 6 (six) hours as needed.    [provider]  lisinopril (ZESTRIL) 5 MG tablet Take 10 mg by mouth daily.    [provider]  norethindrone-ethinyl estradiol (LOESTRIN FE) 1-20 MG-MCG tablet Take 1 tablet by mouth daily.    [provider]  predniSONE (DELTASONE) 20 MG tablet Take 1 tablet (20 mg total) by mouth 2 (two) times daily. 01/07/12   Lattie Haw, MD  rizatriptan (MAXALT) 10 MG tablet Take 10 mg by mouth as needed for migraine. May repeat in 2 hours if needed    [provider]  topiramate (TOPAMAX) 50 MG tablet Take 75 mg by mouth 2 (two) times daily.    [provider]    Family History Family History  Problem Relation Age of Onset  . Cancer Other        ovarian  . Cancer Mother        breast  . Breast cancer Mother        died age 79  . Healthy Father     Social History Social History   Tobacco Use  . Smoking status: Never Smoker  . Smokeless  tobacco: Never Used  Vaping Use  . Vaping Use: Never used  Substance Use Topics  . Alcohol use: No  . Drug use: No     Allergies   Hydrocodone-acetaminophen   Review of Systems Review of Systems Pertinent negatives listed in HPI  Physical Exam Triage Vital Signs ED Triage Vitals [10/12/19 1650]  Enc Vitals Group     BP (!) 147/74     Pulse Rate (!) 106     Resp 18     Temp 98 F (36.7 C)     Temp Source Oral     SpO2 99 %     Weight      Height      Head Circumference      Peak Flow      Pain Score 10     Pain Loc      Pain Edu?      Excl. in GC?    No data found.  Updated Vital Signs BP (!) 147/74 (BP Location: Right Arm)   Pulse (!) 106   Temp 98 F (36.7 C) (Oral)   Resp 18   LMP  (LMP Unknown)   SpO2 99%   Visual Acuity Right Eye Distance:   Left Eye Distance:   Bilateral Distance:     Right Eye Near:   Left Eye Near:    Bilateral Near:     Physical Exam Vital signs as noted above.  Patient appears to be in moderate pain, and able to bear weight  Lumbosacral spine appears aligned with no protrusion or mass. Painful and reduced LS ROM noted.  Straight leg raise is positive at S LR degrees on bilateral.  Sensation normal, including heel and toe gait.   GCS 15, CN intact Normal mood and affect UC Treatments / Results  Labs (all labs ordered are listed, but only abnormal results are displayed) Labs Reviewed - No data to display  EKG   Radiology DG Lumbar Spine Complete  Result Date: 10/12/2019 CLINICAL DATA:  Severe low back pain after bending over this morning. EXAM: LUMBAR SPINE - COMPLETE 4+ VIEW COMPARISON:  Lumbar radiograph 12/21/2018 FINDINGS: No acute fracture. Normal alignment. Mild endplate spurring and disc space narrowing at T12 and L1. Additional endplate spurring at additional levels with preservation of disc spaces. Suspected L5-S1 facet hypertrophy. Vertebral body heights are normal. The sacroiliac joints are congruent. IMPRESSION: 1. No acute fracture or subluxation of the lumbar spine. 2. Mild multilevel spondylosis. Electronically Signed   By: Narda Rutherford M.D.   On: 10/12/2019 18:04    Procedures Procedures (including critical care time)  Medications Ordered in UC Medications  ketorolac (TORADOL) injection 60 mg (60 mg Intramuscular Given 10/12/19 1702)  dexamethasone (DECADRON) injection 10 mg (10 mg Intramuscular Given 10/12/19 1833)    Initial Impression / Assessment and Plan / UC Course  I have reviewed the triage vital signs and the nursing notes.  Pertinent labs & imaging results that were available during my care of the patient were reviewed by me and considered in my medical decision making (see chart for details).     Acute lumbar radiculopathy.  Lumbar spine imaging confirms the presence of chronic degenerative changes.   Toradol and dexamethasone IM given here in clinic today. Will discharge home with Robaxin for back pain and meloxicam to start tomorrow. Red flags discussed that warrant immediate follow-up in the ER.  Patient also advised that she is unable to completely bear weight within the next 2  hours I would like for her to go to the ER for further evaluation with further diagnostic studies. Final Clinical Impressions(s) / UC Diagnoses   Final diagnoses:  Lumbar radiculopathy     Discharge Instructions     Go immediately to the ER if your symptoms do not improve within 2 hours.    ED Prescriptions    Medication Sig Dispense Auth. Provider   methocarbamol (ROBAXIN) 500 MG tablet Take 1 tablet (500 mg total) by mouth 2 (two) times daily. 30 tablet Bing Neighbors, FNP   meloxicam (MOBIC) 15 MG tablet Take 1 tablet (15 mg total) by mouth daily. 30 tablet Bing Neighbors, FNP     PDMP not reviewed this encounter.   Bing Neighbors, FNP 10/14/19 1918    Bing Neighbors, FNP 10/14/19 1921

## 2019-11-29 ENCOUNTER — Other Ambulatory Visit: Payer: Self-pay | Admitting: Family Medicine

## 2020-03-08 ENCOUNTER — Other Ambulatory Visit: Payer: Self-pay | Admitting: Obstetrics & Gynecology

## 2020-03-08 DIAGNOSIS — Z1231 Encounter for screening mammogram for malignant neoplasm of breast: Secondary | ICD-10-CM

## 2020-03-16 ENCOUNTER — Other Ambulatory Visit: Payer: Self-pay

## 2020-03-16 ENCOUNTER — Ambulatory Visit (INDEPENDENT_AMBULATORY_CARE_PROVIDER_SITE_OTHER): Payer: BC Managed Care – PPO

## 2020-03-16 DIAGNOSIS — Z1231 Encounter for screening mammogram for malignant neoplasm of breast: Secondary | ICD-10-CM | POA: Diagnosis not present

## 2020-10-31 ENCOUNTER — Emergency Department
Admission: EM | Admit: 2020-10-31 | Discharge: 2020-10-31 | Disposition: A | Discharge status: Home / Self Care | Payer: BC Managed Care – PPO | Source: Home / Self Care

## 2020-10-31 ENCOUNTER — Other Ambulatory Visit: Payer: Self-pay

## 2020-10-31 ENCOUNTER — Encounter: Payer: Self-pay | Admitting: Emergency Medicine

## 2020-10-31 DIAGNOSIS — J3489 Other specified disorders of nose and nasal sinuses: Secondary | ICD-10-CM | POA: Diagnosis not present

## 2020-10-31 DIAGNOSIS — J309 Allergic rhinitis, unspecified: Secondary | ICD-10-CM

## 2020-10-31 DIAGNOSIS — J0100 Acute maxillary sinusitis, unspecified: Secondary | ICD-10-CM

## 2020-10-31 MED ORDER — PREDNISONE 20 MG PO TABS: ORAL_TABLET | ORAL | 0 refills | Status: DC

## 2020-10-31 MED ORDER — FEXOFENADINE HCL 180 MG PO TABS: 180.0000 mg | ORAL_TABLET | Freq: Every day | ORAL | 0 refills | Status: DC

## 2020-10-31 MED ORDER — CEFDINIR 300 MG PO CAPS: 300.0000 mg | ORAL_CAPSULE | Freq: Two times a day (BID) | ORAL | 0 refills | Status: AC

## 2020-10-31 NOTE — Discharge Instructions (Addendum)
Advised/instructed patient to take medication as directed with food to completion.  Advised patient may take prednisone burst and Allegra with first dose of antibiotic for the next 5 of 7 days.  May use Allegra afterwards as needed for concurrent postnasal drainage/drip.  Encouraged patient increase daily water intake while taking these medications.

## 2020-10-31 NOTE — ED Provider Notes (Signed)
[facility]    CSN: [id number] Arrival date & time: 10/31/20  1222      History   Chief Complaint Chief Complaint  Patient presents with   Sore Throat    HPI Autumn Ortiz is a 45 y.o. female.   HPI 45 year old female presents with sore throat, runny nose and bilateral ear pain for 5 days.  Patient is vaccinated for COVID-19.  PMH significant for asthma, allergic rhinitis, and acute bronchitis.  Past Medical History:  Diagnosis Date   Asthma    Migraine     Patient Active Problem List   Diagnosis Date Noted   ANXIETY 06/06/2008   ACUTE BRONCHITIS 06/06/2008   ALLERGIC RHINITIS 06/06/2008   ASTHMA 06/06/2008   HEADACHE 06/06/2008    Past Surgical History:  Procedure Laterality Date   WISDOM TOOTH EXTRACTION      OB History   No obstetric history on file.      Home Medications    Prior to Admission medications   Medication Sig Start Date End Date Taking? Authorizing Provider  cefdinir (OMNICEF) 300 MG capsule Take 1 capsule (300 mg total) by mouth 2 (two) times daily for 7 days. 10/31/20 11/07/20 Yes Trevor Iha, FNP  fexofenadine [facility] ALLERGY) 180 MG tablet Take 1 tablet (180 mg total) by mouth daily for 15 days. 10/31/20 11/15/20 Yes Trevor Iha, FNP  predniSONE (DELTASONE) 20 MG tablet Take 3 tabs PO x 5 days. 10/31/20  Yes Trevor Iha, FNP  albuterol (PROVENTIL HFA;VENTOLIN HFA) 108 (90 BASE) MCG/ACT inhaler Inhale 2 puffs into the lungs every 6 (six) hours as needed for wheezing. 01/07/12   Lattie Haw, MD  ALPRAZolam Prudy Feeler) 0.25 MG tablet Take 0.25 mg by mouth at bedtime as needed for anxiety.    [provider]  lisinopril (ZESTRIL) 5 MG tablet Take 10 mg by mouth daily.    [provider]  meloxicam (MOBIC) 15 MG tablet Take 1 tablet (15 mg total) by mouth daily. 10/12/19   Bing Neighbors, FNP  methocarbamol (ROBAXIN) 500 MG tablet Take 1 tablet (500 mg total) by mouth 2 (two) times daily. 10/12/19    Bing Neighbors, FNP  norethindrone-ethinyl estradiol (LOESTRIN FE) 1-20 MG-MCG tablet Take 1 tablet by mouth daily.    [provider]  rizatriptan (MAXALT) 10 MG tablet Take 10 mg by mouth as needed for migraine. May repeat in 2 hours if needed    [provider]  topiramate (TOPAMAX) 50 MG tablet Take 75 mg by mouth 2 (two) times daily.    [provider]    Family History Family History  Problem Relation Age of Onset   Cancer Other        ovarian   Cancer Mother        breast   Breast cancer Mother        died age 46   Healthy Father     Social History Social History   Tobacco Use   Smoking status: Never   Smokeless tobacco: Never  Vaping Use   Vaping Use: Never used  Substance Use Topics   Alcohol use: No   Drug use: No     Allergies   Hydrocodone-acetaminophen   Review of Systems Review of Systems  HENT:  Positive for ear pain, rhinorrhea and sore throat.   All other systems reviewed and are negative.   Physical Exam Triage Vital Signs ED Triage Vitals  Enc Vitals Group     BP 10/31/20  1303 110/75     Pulse Rate 10/31/20 1303 95     Resp 10/31/20 1303 19     Temp 10/31/20 1303 98.3 F (36.8 C)     Temp Source 10/31/20 1303 Oral     SpO2 10/31/20 1303 96 %     Weight 10/31/20 1304 200 lb (90.7 kg)     Height 10/31/20 1304 5\' 3"  (1.6 m)     Head Circumference --      Peak Flow --      Pain Score 10/31/20 1303 4     Pain Loc --      Pain Edu? --      Excl. in GC? --    No data found.  Updated Vital Signs BP 110/75 (BP Location: Left Arm)   Pulse 95   Temp 98.3 F (36.8 C) (Oral)   Resp 19   Ht 5\' 3"  (1.6 m)   Wt 200 lb (90.7 kg)   SpO2 96%   BMI 35.43 kg/m       Physical Exam Vitals and nursing note reviewed.  Constitutional:      General: She is not in acute distress.    Appearance: Normal appearance. She is obese. She is ill-appearing.  HENT:     Head: Normocephalic and atraumatic.     Right Ear:  Tympanic membrane and external ear normal.     Left Ear: Tympanic membrane and external ear normal.     Ears:     Comments: Moderate eustachian tube dysfunction noted bilaterally    Nose: Congestion and rhinorrhea present.     Mouth/Throat:     Mouth: Mucous membranes are moist.     Pharynx: Oropharynx is clear.     Comments: Moderate amount of clear drainage of posterior oropharynx noted Eyes:     Extraocular Movements: Extraocular movements intact.     Conjunctiva/sclera: Conjunctivae normal.     Pupils: Pupils are equal, round, and reactive to light.  Cardiovascular:     Rate and Rhythm: Normal rate and regular rhythm.     Pulses: Normal pulses.     Heart sounds: Normal heart sounds.  Pulmonary:     Effort: Pulmonary effort is normal.     Breath sounds: Normal breath sounds. No wheezing, rhonchi or rales.  Musculoskeletal:        General: Normal range of motion.     Cervical back: Normal range of motion and neck supple.  Lymphadenopathy:     Cervical: Cervical adenopathy present.  Skin:    General: Skin is warm and dry.  Neurological:     General: No focal deficit present.     Mental Status: She is alert and oriented to person, place, and time. Mental status is at baseline.     UC Treatments / Results  Labs (all labs ordered are listed, but only abnormal results are displayed) Labs Reviewed - No data to display  EKG   Radiology No results found.  Procedures Procedures (including critical care time)  Medications Ordered in UC Medications - No data to display  Initial Impression / Assessment and Plan / UC Course  I have reviewed the triage vital signs and the nursing notes.  Pertinent labs & imaging results that were available during my care of the patient were reviewed by me and considered in my medical decision making (see chart for details).     MDM: 1.  Acute maxillary sinusitis-Rx'd Cefdinir; 2.  Sinus pressure-Rx'd Prednisone burst; 3.  Allergic  rhinitis-Rx'd Allegra. Advised/instructed patient to take medication as directed with food to completion.  Advised patient may take prednisone burst and Allegra with first dose of antibiotic for the next 5 of 7 days.  May use Allegra afterwards as needed for concurrent postnasal drainage/drip.  Encouraged patient increase daily water intake while taking these medications.  Final Clinical Impressions(s) / UC Diagnoses   Final diagnoses:  Acute maxillary sinusitis, recurrence not specified  Sinus pressure  Allergic rhinitis, unspecified seasonality, unspecified trigger     Discharge Instructions      Advised/instructed patient to take medication as directed with food to completion.  Advised patient may take prednisone burst and Allegra with first dose of antibiotic for the next 5 of 7 days.  May use Allegra afterwards as needed for concurrent postnasal drainage/drip.  Encouraged patient increase daily water intake while taking these medications.     ED Prescriptions     Medication Sig Dispense Auth. Provider   cefdinir (OMNICEF) 300 MG capsule Take 1 capsule (300 mg total) by mouth 2 (two) times daily for 7 days. 14 capsule Trevor Iha, FNP   predniSONE (DELTASONE) 20 MG tablet Take 3 tabs PO x 5 days. 15 tablet Trevor Iha, FNP   fexofenadine [facility] ALLERGY) 180 MG tablet Take 1 tablet (180 mg total) by mouth daily for 15 days. 15 tablet Trevor Iha, FNP      PDMP not reviewed this encounter.   Trevor Iha, FNP 10/31/20 1451

## 2020-10-31 NOTE — ED Triage Notes (Signed)
Sore throat, runny nose,bi-lateral ear pain x 5 days Vaccinated

## 2020-12-19 ENCOUNTER — Encounter: Payer: Self-pay | Admitting: Physician Assistant

## 2020-12-19 ENCOUNTER — Ambulatory Visit: Payer: BC Managed Care – PPO | Admitting: Physician Assistant

## 2020-12-19 ENCOUNTER — Ambulatory Visit (INDEPENDENT_AMBULATORY_CARE_PROVIDER_SITE_OTHER): Payer: BC Managed Care – PPO

## 2020-12-19 ENCOUNTER — Telehealth (HOSPITAL_BASED_OUTPATIENT_CLINIC_OR_DEPARTMENT_OTHER): Payer: Self-pay | Admitting: Family Medicine

## 2020-12-19 ENCOUNTER — Other Ambulatory Visit: Payer: Self-pay

## 2020-12-19 VITALS — BP 147/87 | HR 113 | Resp 16 | Ht 63.0 in | Wt 206.0 lb

## 2020-12-19 DIAGNOSIS — R071 Chest pain on breathing: Secondary | ICD-10-CM

## 2020-12-19 DIAGNOSIS — R000 Tachycardia, unspecified: Secondary | ICD-10-CM

## 2020-12-19 DIAGNOSIS — R052 Subacute cough: Secondary | ICD-10-CM

## 2020-12-19 DIAGNOSIS — R053 Chronic cough: Secondary | ICD-10-CM | POA: Insufficient documentation

## 2020-12-19 DIAGNOSIS — R0602 Shortness of breath: Secondary | ICD-10-CM | POA: Insufficient documentation

## 2020-12-19 LAB — CBC WITH DIFFERENTIAL/PLATELET
Absolute Monocytes: 435 cells/uL (ref 200–950)
Basophils Absolute: 41 cells/uL (ref 0–200)
Basophils Relative: 0.5 %
Eosinophils Absolute: 213 cells/uL (ref 15–500)
Eosinophils Relative: 2.6 %
HCT: 41.6 % (ref 35.0–45.0)
Hemoglobin: 14 g/dL (ref 11.7–15.5)
Lymphs Abs: 2362 cells/uL (ref 850–3900)
MCH: 30.3 pg (ref 27.0–33.0)
MCHC: 33.7 g/dL (ref 32.0–36.0)
MCV: 90 fL (ref 80.0–100.0)
MPV: 9.2 fL (ref 7.5–12.5)
Monocytes Relative: 5.3 %
Neutro Abs: 5150 cells/uL (ref 1500–7800)
Neutrophils Relative %: 62.8 %
Platelets: 319 10*3/uL (ref 140–400)
RBC: 4.62 10*6/uL (ref 3.80–5.10)
RDW: 13.4 % (ref 11.0–15.0)
Total Lymphocyte: 28.8 %
WBC: 8.2 10*3/uL (ref 3.8–10.8)

## 2020-12-19 LAB — COMPLETE METABOLIC PANEL WITH GFR
AG Ratio: 1.1 (calc) (ref 1.0–2.5)
ALT: 18 U/L (ref 6–29)
AST: 17 U/L (ref 10–35)
Albumin: 3.9 g/dL (ref 3.6–5.1)
Alkaline phosphatase (APISO): 62 U/L (ref 31–125)
BUN: 15 mg/dL (ref 7–25)
CO2: 26 mmol/L (ref 20–32)
Calcium: 9.4 mg/dL (ref 8.6–10.2)
Chloride: 104 mmol/L (ref 98–110)
Creat: 0.93 mg/dL (ref 0.50–0.99)
Globulin: 3.7 g/dL (calc) (ref 1.9–3.7)
Glucose, Bld: 160 mg/dL — ABNORMAL HIGH (ref 65–99)
Potassium: 3.8 mmol/L (ref 3.5–5.3)
Sodium: 138 mmol/L (ref 135–146)
Total Bilirubin: 0.2 mg/dL (ref 0.2–1.2)
Total Protein: 7.6 g/dL (ref 6.1–8.1)
eGFR: 77 mL/min/{1.73_m2} (ref >=60)

## 2020-12-19 LAB — D-DIMER, QUANTITATIVE: D-Dimer, Quant: 3.91 mcg/mL FEU — ABNORMAL HIGH (ref <0.50)

## 2020-12-19 MED ORDER — LEVOFLOXACIN 750 MG PO TABS: 750.0000 mg | ORAL_TABLET | Freq: Every day | ORAL | 0 refills | Status: DC

## 2020-12-19 NOTE — Progress Notes (Signed)
You do have what looks like pneumonia. I am sending over levaquin for 7 days.

## 2020-12-19 NOTE — Telephone Encounter (Signed)
Received call from on-call nurse about elevated D-dimer.  Attempt to call patient at [facility] phone number listed x2 but with no answer.  Will attempt to call back later to discuss lab results.

## 2020-12-19 NOTE — Progress Notes (Signed)
New Patient Office Visit  Subjective:  Patient ID: Autumn Ortiz, female    DOB: 1975/09/17  Age: 45 y.o. MRN: [id number]  CC:  Chief Complaint  Patient presents with   Establish Care   Cough    Post nasal drainage, sore throat, headache, facial pressure/pain for several weeks    HPI Patsy Cumpton presents to the clinic  with cough, sinus drainage,SOB, chest pain with breathing and just not feeling good for 8 weeks. Started with treatment for sinus infection back in oct 2022. She has since tested negative for covid, flu, strep but been treated with omnicef, augmentin, zpak, prednisone, cough syrup with temporary relief and then symptoms return. She has not had CXR. She has not had covid vaccine.     Past Medical History:  Diagnosis Date   Asthma    Migraine     Past Surgical History:  Procedure Laterality Date   WISDOM TOOTH EXTRACTION      Family History  Problem Relation Age of Onset   Cancer Other        ovarian   Cancer Mother        breast   Breast cancer Mother        died age 58   Healthy Father     Social History   Socioeconomic History   Marital status: Married    Spouse name: Not on file   Number of children: Not on file   Years of education: Not on file   Highest education level: Not on file  Occupational History   Not on file  Tobacco Use   Smoking status: Never   Smokeless tobacco: Never  Vaping Use   Vaping Use: Never used  Substance and Sexual Activity   Alcohol use: No   Drug use: No   Sexual activity: Not on file  Other Topics Concern   Not on file  Social History Narrative   Not on file   Social Determinants of Health   Financial Resource Strain: Not on file  Food Insecurity: Not on file  Transportation Needs: Not on file  Physical Activity: Not on file  Stress: Not on file  Social Connections: Not on file  Intimate Partner Violence: Not on file    ROS Review of Systems See hPI.  Objective:   Today's Vitals: BP  (!) 147/87    Pulse (!) 113    Resp 16    Ht 5\' 3"  (1.6 m)    Wt 206 lb (93.4 kg)    SpO2 99%    BMI 36.49 kg/m   Physical Exam Vitals reviewed.  Constitutional:      Appearance: Normal appearance. She is obese.  HENT:     Head: Normocephalic.     Right Ear: Tympanic membrane, ear canal and external ear normal. There is no impacted cerumen.     Left Ear: Ear canal and external ear normal. There is no impacted cerumen.     Ears:     Comments: Left TM a little dull.     Nose: Congestion and rhinorrhea present.     Mouth/Throat:     Mouth: Mucous membranes are moist.     Pharynx: Posterior oropharyngeal erythema present. No oropharyngeal exudate.  Eyes:     Extraocular Movements: Extraocular movements intact.     Conjunctiva/sclera: Conjunctivae normal.     Pupils: Pupils are equal, round, and reactive to light.  Neck:     Vascular: No carotid bruit.  Cardiovascular:  Rate and Rhythm: Tachycardia present.     Pulses: Normal pulses.     Heart sounds: Normal heart sounds.  Pulmonary:     Effort: Pulmonary effort is normal.     Breath sounds: Normal breath sounds.  Musculoskeletal:     Cervical back: Normal range of motion and neck supple. No rigidity or tenderness.     Right lower leg: No edema.     Left lower leg: No edema.  Lymphadenopathy:     Cervical: Cervical adenopathy present.  Neurological:     General: No focal deficit present.     Mental Status: She is alert.  Psychiatric:        Mood and Affect: Mood normal.    Assessment & Plan:  Marland KitchenMarland KitchenBrandie was seen today for establish care and cough.  Diagnoses and all orders for this visit:  Tachycardia -     DG Chest 2 View; Future -     D-Dimer, Quantitative -     CBC with Differential/Platelet -     COMPLETE METABOLIC PANEL WITH GFR  Subacute cough -     DG Chest 2 View; Future -     D-Dimer, Quantitative -     CBC with Differential/Platelet -     COMPLETE METABOLIC PANEL WITH GFR  Shortness of breath -      DG Chest 2 View; Future -     D-Dimer, Quantitative -     CBC with Differential/Platelet -     COMPLETE METABOLIC PANEL WITH GFR  Chest pain varying with breathing -     DG Chest 2 View; Future -     D-Dimer, Quantitative -     CBC with Differential/Platelet -     COMPLETE METABOLIC PANEL WITH GFR  Unclear etiology but appears to be viral infection that is lingering and perhaps some bacterial secondary infections.  CXR ordered. CBC, CMP, D-Dimer ordered.  Pt does not have any signs or symptoms of DVT except SOB and tachycardia. She is on OCP.  If positive D-dimer will get CTA.  If normal D-Dimer and no need for CTA consider abx plus or minus prednisone.  Continue using albuterol.  Follow up as needed.     Follow-up: Return if symptoms worsen or fail to improve.   Tandy Gaw, PA-C

## 2020-12-19 NOTE — Telephone Encounter (Signed)
Attempted to call patient to discuss critical lab result, no answer received.

## 2020-12-20 ENCOUNTER — Ambulatory Visit (INDEPENDENT_AMBULATORY_CARE_PROVIDER_SITE_OTHER): Payer: BC Managed Care – PPO

## 2020-12-20 ENCOUNTER — Other Ambulatory Visit: Payer: Self-pay | Admitting: Physician Assistant

## 2020-12-20 ENCOUNTER — Encounter: Payer: Self-pay | Admitting: Physician Assistant

## 2020-12-20 DIAGNOSIS — R071 Chest pain on breathing: Secondary | ICD-10-CM | POA: Diagnosis not present

## 2020-12-20 DIAGNOSIS — R791 Abnormal coagulation profile: Secondary | ICD-10-CM

## 2020-12-20 MED ORDER — IOHEXOL 350 MG/ML SOLN
100.0000 mL | Freq: Once | INTRAVENOUS | Status: AC | PRN
  Administered 2020-12-20: 100 mL via INTRAVENOUS

## 2020-12-20 NOTE — Addendum Note (Signed)
Addended bySilvio Pate on: 12/20/2020 09:18 AM   Modules accepted: Orders

## 2020-12-20 NOTE — Progress Notes (Signed)
Patient made aware, given information to [facility] to go for CTA.

## 2020-12-20 NOTE — Progress Notes (Signed)
D-dimer very elevated. She needs a CTA of lungs, now.

## 2020-12-21 ENCOUNTER — Other Ambulatory Visit: Payer: Self-pay | Admitting: Physician Assistant

## 2020-12-21 MED ORDER — HYDROCOD POLST-CPM POLST ER 10-8 MG/5ML PO SUER: 5.0000 mL | Freq: Two times a day (BID) | ORAL | 0 refills | Status: DC | PRN

## 2020-12-21 MED ORDER — PREDNISONE 20 MG PO TABS: ORAL_TABLET | ORAL | 0 refills | Status: DC

## 2020-12-21 NOTE — Progress Notes (Signed)
CTA negative.  CXR showed some pneumonia.  On 2nd day of levaquin.  Start prednisone taper.  Tussionex cough syrup.

## 2020-12-24 NOTE — Progress Notes (Signed)
Pt was called on the 16th with negative PE results and sent in prednisone to go with her levaquin. Call pt today and see how she is feeling.

## 2021-02-20 ENCOUNTER — Other Ambulatory Visit: Payer: Self-pay

## 2021-02-20 ENCOUNTER — Ambulatory Visit: Payer: BC Managed Care – PPO | Admitting: Physician Assistant

## 2021-02-20 VITALS — BP 149/96 | HR 98 | Ht 61.0 in | Wt 202.0 lb

## 2021-02-20 DIAGNOSIS — J3089 Other allergic rhinitis: Secondary | ICD-10-CM | POA: Diagnosis not present

## 2021-02-20 DIAGNOSIS — G44221 Chronic tension-type headache, intractable: Secondary | ICD-10-CM

## 2021-02-20 DIAGNOSIS — R0982 Postnasal drip: Secondary | ICD-10-CM

## 2021-02-20 DIAGNOSIS — M542 Cervicalgia: Secondary | ICD-10-CM

## 2021-02-20 DIAGNOSIS — M5030 Other cervical disc degeneration, unspecified cervical region: Secondary | ICD-10-CM

## 2021-02-20 MED ORDER — BACLOFEN 10 MG PO TABS: 10.0000 mg | ORAL_TABLET | Freq: Three times a day (TID) | ORAL | 1 refills | Status: DC

## 2021-02-20 MED ORDER — FEXOFENADINE-PSEUDOEPHED ER 180-240 MG PO TB24: 1.0000 | ORAL_TABLET | Freq: Every day | ORAL | 3 refills | Status: DC

## 2021-02-20 MED ORDER — MONTELUKAST SODIUM 10 MG PO TABS: 10.0000 mg | ORAL_TABLET | Freq: Every day | ORAL | 3 refills | Status: AC

## 2021-02-20 MED ORDER — KETOROLAC TROMETHAMINE 60 MG/2ML IM SOLN
60.0000 mg | Freq: Once | INTRAMUSCULAR | Status: AC
  Administered 2021-02-20: 60 mg via INTRAMUSCULAR

## 2021-02-20 MED ORDER — METHYLPREDNISOLONE ACETATE 80 MG/ML IJ SUSP
80.0000 mg | Freq: Once | INTRAMUSCULAR | Status: AC
  Administered 2021-02-20: 80 mg via INTRAMUSCULAR

## 2021-02-20 NOTE — Patient Instructions (Signed)
Start singulair at bedtime and allegra D in the morning  Baclofen as needed but every night.   Postnasal Drip Postnasal drip is the feeling of mucus going down the back of your throat. Mucus is a slimy substance that moistens and cleans your nose and throat, as well as the air pockets in face bones near your forehead and cheeks (sinuses). Small amounts of mucus pass from your nose and sinuses down the back of your throat all the time. This is normal. When you produce too much mucus or the mucus gets too thick, you can feel it. Some common causes of postnasal drip include: Having more mucus because of: A cold or the flu. Allergies. Cold air. Certain medicines. Having more mucus that is thicker because of: A sinus or nasal infection. Dry air. A food allergy. Follow these instructions at home: Relieving discomfort  Gargle with a salt-water mixture 3-4 times a day or as needed. To make a salt-water mixture, completely dissolve -1 tsp of salt in 1 cup of warm water. If the air in your home is dry, use a humidifier to add moisture to the air. Use a saline spray or container (neti pot) to flush out the nose (nasal irrigation). These methods can help clear away mucus and keep the nasal passages moist. General instructions Take over-the-counter and prescription medicines only as told by your health care provider. Follow instructions from your health care provider about eating or drinking restrictions. You may need to avoid caffeine. Avoid things that you know you are allergic to (allergens), like dust, mold, pollen, pets, or certain foods. Drink enough fluid to keep your urine pale yellow. Keep all follow-up visits as told by your health care provider. This is important. Contact a health care provider if: You have a fever. You have a sore throat. You have difficulty swallowing. You have headache. You have sinus pain. You have a cough that does not go away. The mucus from your nose becomes  thick and is green or yellow in color. You have cold or flu symptoms that last more than 10 days. Summary Postnasal drip is the feeling of mucus going down the back of your throat. If your health care provider approves, use nasal irrigation or a nasal spray 2?4 times a day. Avoid things that you know you are allergic to (allergens), like dust, mold, pollen, pets, or certain foods. This information is not intended to replace advice given to you by your health care provider. Make sure you discuss any questions you have with your health care provider. Document Revised: 10/04/2019 Document Reviewed: 10/04/2019 Elsevier Patient Education  2022 [employer].

## 2021-02-20 NOTE — Progress Notes (Signed)
Subjective:    Patient ID: Autumn Ortiz, female    DOB: Jan 09, 1975, 46 y.o.   MRN: [id number]  HPI Pt is a 46 yo obese female who c/o post nasal drip since October when she had covid. She has been on 2-3 abx and prednisone for some ongoing sinusitis. She has hx of seasonal and environmental allergies. She did allergy shots for a while. She is tired of the PND. She denies any fever, chills, sinus pressure, headache, ear pain, ST.   She is also having chronic tension headache from her neck tightness. No radiation of pain down arms. No numbness and tingling. Has cervical DDD.   .. Active Ambulatory Problems    Diagnosis Date Noted   ANXIETY 06/06/2008   ACUTE BRONCHITIS 06/06/2008   ALLERGIC RHINITIS 06/06/2008   ASTHMA 06/06/2008   HEADACHE 06/06/2008   Chest pain varying with breathing 12/19/2020   Shortness of breath 12/19/2020   Subacute cough 12/19/2020   Tachycardia 12/19/2020   Neck pain 02/22/2021   Chronic tension-type headache, intractable 02/22/2021   Post-nasal drip 02/22/2021   Resolved Ambulatory Problems    Diagnosis Date Noted   No Resolved Ambulatory Problems   Past Medical History:  Diagnosis Date   Anxiety    Asthma    Hypertension    Migraine     Review of Systems See HPI.     Objective:   Physical Exam Vitals reviewed.  Constitutional:      Appearance: Normal appearance. She is obese.  HENT:     Head: Normocephalic.     Right Ear: Tympanic membrane, ear canal and external ear normal. There is no impacted cerumen.     Left Ear: Tympanic membrane, ear canal and external ear normal. There is no impacted cerumen.     Nose: Nose normal.     Mouth/Throat:     Mouth: Mucous membranes are moist.  Eyes:     Pupils: Pupils are equal, round, and reactive to light.  Cardiovascular:     Rate and Rhythm: Normal rate and regular rhythm.  Pulmonary:     Effort: Pulmonary effort is normal.     Breath sounds: Normal breath sounds.  Neurological:      General: No focal deficit present.     Mental Status: She is alert.  Psychiatric:        Mood and Affect: Mood normal.          Assessment & Plan:  Marland KitchenMarland KitchenBrandie was seen today for sinusitis.  Diagnoses and all orders for this visit:  Post-nasal drip -     montelukast (SINGULAIR) 10 MG tablet; Take 1 tablet (10 mg total) by mouth at bedtime. -     fexofenadine-pseudoephedrine (ALLEGRA-D ALLERGY & CONGESTION) 180-240 MG 24 hr tablet; Take 1 tablet by mouth daily.  Chronic tension-type headache, intractable -     baclofen (LIORESAL) 10 MG tablet; Take 1 tablet (10 mg total) by mouth 3 (three) times daily. -     ketorolac (TORADOL) injection 60 mg -     methylPREDNISolone acetate (DEPO-MEDROL) injection 80 mg  Neck pain -     baclofen (LIORESAL) 10 MG tablet; Take 1 tablet (10 mg total) by mouth 3 (three) times daily.   Start singulair and Careers adviser D daily.  No signs of bacterial infection Pt does not like nasal sprays due to nosebleeds   Cervial DDD/neck pain/tension headaches-  Has mobic not helped.  Start baclofen at night Continue massages/tens unit/icy hot patches No red flags  if continues consider Dr. Karie Schwalbe and MRI Toradol and depomedrol given today.  ?cymbalta or gabapentin in the future

## 2021-02-22 ENCOUNTER — Encounter: Payer: Self-pay | Admitting: Physician Assistant

## 2021-02-22 ENCOUNTER — Telehealth: Payer: Self-pay | Admitting: Neurology

## 2021-02-22 DIAGNOSIS — R0982 Postnasal drip: Secondary | ICD-10-CM | POA: Insufficient documentation

## 2021-02-22 DIAGNOSIS — G44221 Chronic tension-type headache, intractable: Secondary | ICD-10-CM | POA: Insufficient documentation

## 2021-02-22 DIAGNOSIS — M5030 Other cervical disc degeneration, unspecified cervical region: Secondary | ICD-10-CM | POA: Insufficient documentation

## 2021-02-22 DIAGNOSIS — J3089 Other allergic rhinitis: Secondary | ICD-10-CM | POA: Insufficient documentation

## 2021-02-22 DIAGNOSIS — M542 Cervicalgia: Secondary | ICD-10-CM | POA: Insufficient documentation

## 2021-02-22 NOTE — Telephone Encounter (Signed)
Patient called and left vm requesting letter to be excused from [place] duty due to anxiety/panic attacks, back pain, and migraines. Okay to write? Note pended.

## 2021-02-22 NOTE — Telephone Encounter (Signed)
LVM letting patient know letter written and at the front for pick up.

## 2021-03-15 ENCOUNTER — Encounter: Payer: Self-pay | Admitting: Physician Assistant

## 2021-03-15 NOTE — Telephone Encounter (Signed)
Needs appt. This wasn't mentioned in prior note and was seen almost 4 weeks ago so I would consider this a new complaint.  ?

## 2021-03-21 ENCOUNTER — Other Ambulatory Visit: Payer: Self-pay | Admitting: Obstetrics & Gynecology

## 2021-03-21 DIAGNOSIS — Z1231 Encounter for screening mammogram for malignant neoplasm of breast: Secondary | ICD-10-CM

## 2021-03-22 ENCOUNTER — Ambulatory Visit: Payer: BC Managed Care – PPO | Admitting: Physician Assistant

## 2021-03-22 ENCOUNTER — Other Ambulatory Visit: Payer: Self-pay

## 2021-03-22 ENCOUNTER — Encounter: Payer: Self-pay | Admitting: Physician Assistant

## 2021-03-22 VITALS — BP 122/74 | HR 100 | Ht 63.0 in | Wt 206.0 lb

## 2021-03-22 DIAGNOSIS — R252 Cramp and spasm: Secondary | ICD-10-CM

## 2021-03-22 DIAGNOSIS — K219 Gastro-esophageal reflux disease without esophagitis: Secondary | ICD-10-CM | POA: Diagnosis not present

## 2021-03-22 DIAGNOSIS — R052 Subacute cough: Secondary | ICD-10-CM

## 2021-03-22 DIAGNOSIS — R053 Chronic cough: Secondary | ICD-10-CM

## 2021-03-22 DIAGNOSIS — R0602 Shortness of breath: Secondary | ICD-10-CM

## 2021-03-22 MED ORDER — BUDESONIDE-FORMOTEROL FUMARATE 160-4.5 MCG/ACT IN AERO: 2.0000 | INHALATION_SPRAY | Freq: Two times a day (BID) | RESPIRATORY_TRACT | 3 refills | Status: DC

## 2021-03-22 MED ORDER — PANTOPRAZOLE SODIUM 40 MG PO TBEC: 40.0000 mg | DELAYED_RELEASE_TABLET | Freq: Every day | ORAL | 3 refills | Status: DC

## 2021-03-22 NOTE — Progress Notes (Signed)
? ?Subjective:  ? ? Patient ID: Autumn Ortiz, female    DOB: November 20, 1975, 46 y.o.   MRN: [id number] ? ?HPI ?Pt is a 46 yo female who presents to the clinic with cough and bilateral leg cramping. She has allergic rhinitis, asthma and hx of bronchitis. She has been sick since the fall of 2022 off and on. She continues to have a dry cough. She is now worried that her leg cramp from time to time. Not with walking. She had elevated d-dimer with negative CTA in December.  ?.. ?Active Ambulatory Problems  ?  Diagnosis Date Noted  ? ANXIETY 06/06/2008  ? ACUTE BRONCHITIS 06/06/2008  ? ALLERGIC RHINITIS 06/06/2008  ? ASTHMA 06/06/2008  ? HEADACHE 06/06/2008  ? Chest pain varying with breathing 12/19/2020  ? Shortness of breath 12/19/2020  ? Subacute cough 12/19/2020  ? Tachycardia 12/19/2020  ? Neck pain 02/22/2021  ? Chronic tension-type headache, intractable 02/22/2021  ? Post-nasal drip 02/22/2021  ? Environmental and seasonal allergies 02/22/2021  ? DDD (degenerative disc disease), cervical 02/22/2021  ? Bilateral leg cramps 03/26/2021  ? Gastroesophageal reflux disease without esophagitis 03/26/2021  ? ?Resolved Ambulatory Problems  ?  Diagnosis Date Noted  ? No Resolved Ambulatory Problems  ? ?Past Medical History:  ?Diagnosis Date  ? Anxiety   ? Asthma   ? Hypertension   ? Migraine   ? ? ? ?Review of Systems ? ?   ?Objective:  ? Physical Exam ?Vitals reviewed.  ?Constitutional:   ?   Appearance: Normal appearance. She is obese.  ?HENT:  ?   Head: Normocephalic.  ?   Right Ear: Tympanic membrane, ear canal and external ear normal. There is no impacted cerumen.  ?   Left Ear: Tympanic membrane, ear canal and external ear normal. There is no impacted cerumen.  ?   Nose: Nose normal.  ?   Mouth/Throat:  ?   Mouth: Mucous membranes are moist.  ?Eyes:  ?   General:     ?   Right eye: No discharge.     ?   Left eye: No discharge.  ?   Conjunctiva/sclera: Conjunctivae normal.  ?Cardiovascular:  ?   Rate and Rhythm: Normal  rate and regular rhythm.  ?   Pulses: Normal pulses.  ?   Heart sounds: Normal heart sounds. No murmur heard. ?Pulmonary:  ?   Effort: Pulmonary effort is normal.  ?   Breath sounds: Normal breath sounds.  ?Abdominal:  ?   General: There is no distension.  ?   Palpations: Abdomen is soft. There is no mass.  ?   Tenderness: There is no abdominal tenderness. There is no right CVA tenderness, left CVA tenderness, guarding or rebound.  ?   Hernia: No hernia is present.  ?Musculoskeletal:  ?   Right lower leg: No edema.  ?   Left lower leg: No edema.  ?   Comments: Symmetric 15inch calves ?No redness, warmth, swelling, tenderness to palpation.  ?Negative homans sign  ?Neurological:  ?   General: No focal deficit present.  ?   Mental Status: She is alert and oriented to person, place, and time.  ?Psychiatric:     ?   Mood and Affect: Mood normal.  ? ? ? ? ? ?   ?Assessment & Plan:  ?..[name] was seen today for leg pain. ? ?Diagnoses and all orders for this visit: ? ?Bilateral leg cramps ? ?Gastroesophageal reflux disease without esophagitis ?-     pantoprazole (  PROTONIX) 40 MG tablet; Take 1 tablet (40 mg total) by mouth daily. ? ?Shortness of breath ?-     budesonide-formoterol (SYMBICORT) 160-4.5 MCG/ACT inhaler; Inhale 2 puffs into the lungs 2 (two) times daily. ? ?Chronic cough ?-     budesonide-formoterol (SYMBICORT) 160-4.5 MCG/ACT inhaler; Inhale 2 puffs into the lungs 2 (two) times daily. ? ? ?Magnesium for leg cramps, follow up if worsening or not improving ?No concerns of physical exam today ?Make sure staying hydrated ?Start symbicort and protonix for cough ?Follow up in 1 month  ?

## 2021-03-22 NOTE — Patient Instructions (Signed)
Magnesium 400mg  at bedtime.  ? ?Leg Cramps ?Leg cramps occur when one or more muscles tighten and a person has no control over it (involuntary muscle contraction). Muscle cramps are most common in the calf muscles of the leg. They can occur during exercise or at rest. Leg cramps are painful, and they may last for a few seconds to a few minutes. Cramps may return several times before they finally stop. ?Usually, leg cramps are not caused by a serious medical problem. In many cases, the cause is not known. Some common causes include: ?Excessive physical effort (overexertion), such as during intense exercise. ?Doing the same motion over and over. ?Staying in a certain position for a long period of time. ?Improper preparation, form, or technique while doing a sport or an activity. ?Dehydration. ?Injury. ?Side effects of certain medicines. ?Abnormally low levels of minerals in your blood (electrolytes), especially potassium and calcium. This could result from: ?Pregnancy. ?Taking diuretic medicines. ?Follow these instructions at home: ?Eating and drinking ?Drink enough fluid to keep your urine pale yellow. Staying hydrated may help prevent cramps. ?Eat a healthy diet that includes plenty of nutrients to help your muscles function. A healthy diet includes fruits and vegetables, lean protein, whole grains, and low-fat or nonfat dairy products. ?Managing pain, stiffness, and swelling ?  ?Try massaging, stretching, and relaxing the affected muscle. Do this for several minutes at a time. ?If directed, put ice on areas that are sore or painful after a cramp. To do this: ?Put ice in a plastic bag. ?Place a towel between your skin and the bag. ?Leave the ice on for 20 minutes, 2-3 times a day. ?Remove the ice if your skin turns bright red. This is very important. If you cannot feel pain, heat, or cold, you have a greater risk of damage to the area. ?If directed, apply heat to muscles that are tense or tight. Do this before you  exercise, or as often as told by your health care provider. Use the heat source that your health care provider recommends, such as a moist heat pack or a heating pad. To do this: ?Place a towel between your skin and the heat source. ?Leave the heat on for 20-30 minutes. ?Remove the heat if your skin turns bright red. This is especially important if you are unable to feel pain, heat, or cold. You may have a greater risk of getting burned. ?Try taking hot showers or baths to help relax tight muscles. ?General instructions ?If you are having frequent leg cramps, avoid intense exercise for several days. ?Take over-the-counter and prescription medicines only as told by your health care provider. ?Keep all follow-up visits. This is important. ?Contact a health care provider if: ?Your leg cramps get more severe or more frequent, or they do not improve over time. ?Your foot becomes cold, numb, or blue. ?Summary ?Muscle cramps can develop in any muscle, but the most common place is in the calf muscles of the leg. ?Leg cramps are painful, and they may last for a few seconds to a few minutes. ?Usually, leg cramps are not caused by a serious medical problem. Often, the cause is not known. ?Stay hydrated, and take over-the-counter and prescription medicines only as told by your health care provider. ?This information is not intended to replace advice given to you by your health care provider. Make sure you discuss any questions you have with your health care provider. ?Document Revised: 05/11/2019 Document Reviewed: 05/11/2019 ?Elsevier Patient Education ? 2022 [employer] ? ?

## 2021-03-26 ENCOUNTER — Encounter: Payer: Self-pay | Admitting: Physician Assistant

## 2021-03-26 DIAGNOSIS — K219 Gastro-esophageal reflux disease without esophagitis: Secondary | ICD-10-CM | POA: Insufficient documentation

## 2021-03-26 DIAGNOSIS — R252 Cramp and spasm: Secondary | ICD-10-CM | POA: Insufficient documentation

## 2021-04-06 ENCOUNTER — Other Ambulatory Visit: Payer: Self-pay | Admitting: Physician Assistant

## 2021-04-06 DIAGNOSIS — M542 Cervicalgia: Secondary | ICD-10-CM

## 2021-04-06 DIAGNOSIS — G44221 Chronic tension-type headache, intractable: Secondary | ICD-10-CM

## 2021-04-23 ENCOUNTER — Ambulatory Visit: Payer: BC Managed Care – PPO | Admitting: Physician Assistant

## 2021-04-23 VITALS — BP 131/78 | HR 86 | Ht 63.0 in | Wt 207.0 lb

## 2021-04-23 DIAGNOSIS — R0602 Shortness of breath: Secondary | ICD-10-CM

## 2021-04-23 DIAGNOSIS — R252 Cramp and spasm: Secondary | ICD-10-CM | POA: Diagnosis not present

## 2021-04-23 DIAGNOSIS — R053 Chronic cough: Secondary | ICD-10-CM

## 2021-04-23 NOTE — Progress Notes (Signed)
? ?  Subjective:  ? ? Patient ID: Elmarie Devlin, female    DOB: 12/15/1975, 46 y.o.   MRN: [id number] ? ?HPI ?Pt is a 46 yo obese female who presents to the clinic to follow up on chronic cough and leg cramps.  ? ?She is doing much better on Singulair, zyrtec, symbicort and magnesium. She cannot tolerate whole magnesium tablet and having to cut in half for her leg cramps. No CP, palpitations, headaches, or vision changes. She feels the best she has since November of 2022. Cough is almost resolved. Leg cramps are much better and only when she does a lot of physical labor.  ? ? ?.. ?Active Ambulatory Problems  ?  Diagnosis Date Noted  ? ANXIETY 06/06/2008  ? ACUTE BRONCHITIS 06/06/2008  ? ALLERGIC RHINITIS 06/06/2008  ? ASTHMA 06/06/2008  ? HEADACHE 06/06/2008  ? Chest pain varying with breathing 12/19/2020  ? Shortness of breath 12/19/2020  ? Chronic cough 12/19/2020  ? Tachycardia 12/19/2020  ? Neck pain 02/22/2021  ? Chronic tension-type headache, intractable 02/22/2021  ? Post-nasal drip 02/22/2021  ? Environmental and seasonal allergies 02/22/2021  ? DDD (degenerative disc disease), cervical 02/22/2021  ? Leg cramps 03/26/2021  ? Gastroesophageal reflux disease without esophagitis 03/26/2021  ? ?Resolved Ambulatory Problems  ?  Diagnosis Date Noted  ? No Resolved Ambulatory Problems  ? ?Past Medical History:  ?Diagnosis Date  ? Anxiety   ? Asthma   ? Hypertension   ? Migraine   ? ? ? ?Review of Systems  ?All other systems reviewed and are negative. ? ?   ?Objective:  ? Physical Exam ?Vitals reviewed.  ?Constitutional:   ?   Appearance: Normal appearance. She is obese.  ?HENT:  ?   Head: Normocephalic.  ?Cardiovascular:  ?   Rate and Rhythm: Normal rate and regular rhythm.  ?   Pulses: Normal pulses.  ?   Heart sounds: Normal heart sounds.  ?Pulmonary:  ?   Effort: Pulmonary effort is normal.  ?   Breath sounds: Normal breath sounds.  ?Musculoskeletal:  ?   Right lower leg: No edema.  ?   Left lower leg: No edema.   ?Neurological:  ?   General: No focal deficit present.  ?   Mental Status: She is alert and oriented to person, place, and time.  ?Psychiatric:     ?   Mood and Affect: Mood normal.  ? ? ? ? ? ?   ?Assessment & Plan:  ?..Quida was seen today for cough. ? ?Diagnoses and all orders for this visit: ? ?Chronic cough ? ?Leg cramps ? ?Continue on same medications.  ?Doing great ?Follow up in 6 months.  ? ? ?

## 2021-04-26 ENCOUNTER — Encounter: Payer: Self-pay | Admitting: Physician Assistant

## 2021-04-28 ENCOUNTER — Other Ambulatory Visit: Payer: Self-pay | Admitting: Physician Assistant

## 2021-04-28 DIAGNOSIS — M542 Cervicalgia: Secondary | ICD-10-CM

## 2021-04-28 DIAGNOSIS — G44221 Chronic tension-type headache, intractable: Secondary | ICD-10-CM

## 2021-05-02 ENCOUNTER — Ambulatory Visit (INDEPENDENT_AMBULATORY_CARE_PROVIDER_SITE_OTHER): Payer: BC Managed Care – PPO

## 2021-05-02 DIAGNOSIS — Z1231 Encounter for screening mammogram for malignant neoplasm of breast: Secondary | ICD-10-CM | POA: Diagnosis not present

## 2021-08-05 ENCOUNTER — Encounter: Payer: Self-pay | Admitting: Physician Assistant

## 2021-08-05 ENCOUNTER — Other Ambulatory Visit: Payer: Self-pay | Admitting: Physician Assistant

## 2021-08-08 ENCOUNTER — Encounter: Payer: Self-pay | Admitting: Neurology

## 2021-08-14 ENCOUNTER — Other Ambulatory Visit: Payer: Self-pay | Admitting: Physician Assistant

## 2021-08-14 DIAGNOSIS — G44221 Chronic tension-type headache, intractable: Secondary | ICD-10-CM

## 2021-08-14 DIAGNOSIS — M542 Cervicalgia: Secondary | ICD-10-CM

## 2021-08-16 MED ORDER — LISINOPRIL 5 MG PO TABS: 10.0000 mg | ORAL_TABLET | Freq: Every day | ORAL | 0 refills | Status: DC

## 2021-08-16 MED ORDER — TOPIRAMATE 25 MG PO TABS: 25.0000 mg | ORAL_TABLET | Freq: Three times a day (TID) | ORAL | 0 refills | Status: DC

## 2021-09-05 ENCOUNTER — Other Ambulatory Visit: Payer: Self-pay | Admitting: Physician Assistant

## 2021-09-05 DIAGNOSIS — K219 Gastro-esophageal reflux disease without esophagitis: Secondary | ICD-10-CM

## 2021-10-23 ENCOUNTER — Ambulatory Visit: Payer: BC Managed Care – PPO | Admitting: Physician Assistant

## 2021-10-23 VITALS — BP 130/74 | HR 100 | Ht 63.0 in | Wt 203.0 lb

## 2021-10-23 DIAGNOSIS — Z131 Encounter for screening for diabetes mellitus: Secondary | ICD-10-CM

## 2021-10-23 DIAGNOSIS — R053 Chronic cough: Secondary | ICD-10-CM

## 2021-10-23 DIAGNOSIS — K219 Gastro-esophageal reflux disease without esophagitis: Secondary | ICD-10-CM

## 2021-10-23 DIAGNOSIS — R0602 Shortness of breath: Secondary | ICD-10-CM | POA: Diagnosis not present

## 2021-10-23 DIAGNOSIS — Z1329 Encounter for screening for other suspected endocrine disorder: Secondary | ICD-10-CM

## 2021-10-23 DIAGNOSIS — G44221 Chronic tension-type headache, intractable: Secondary | ICD-10-CM | POA: Diagnosis not present

## 2021-10-23 DIAGNOSIS — F419 Anxiety disorder, unspecified: Secondary | ICD-10-CM

## 2021-10-23 DIAGNOSIS — I10 Essential (primary) hypertension: Secondary | ICD-10-CM

## 2021-10-23 DIAGNOSIS — M5030 Other cervical disc degeneration, unspecified cervical region: Secondary | ICD-10-CM | POA: Diagnosis not present

## 2021-10-23 DIAGNOSIS — M542 Cervicalgia: Secondary | ICD-10-CM | POA: Diagnosis not present

## 2021-10-23 DIAGNOSIS — Z13220 Encounter for screening for lipoid disorders: Secondary | ICD-10-CM

## 2021-10-23 MED ORDER — RIZATRIPTAN BENZOATE 10 MG PO TABS: 10.0000 mg | ORAL_TABLET | ORAL | 5 refills | Status: DC | PRN

## 2021-10-23 MED ORDER — BUDESONIDE-FORMOTEROL FUMARATE 160-4.5 MCG/ACT IN AERO: 2.0000 | INHALATION_SPRAY | Freq: Two times a day (BID) | RESPIRATORY_TRACT | 1 refills | Status: AC

## 2021-10-23 MED ORDER — LISINOPRIL-HYDROCHLOROTHIAZIDE 10-12.5 MG PO TABS: 1.0000 | ORAL_TABLET | Freq: Every day | ORAL | 1 refills | Status: DC

## 2021-10-23 MED ORDER — MELOXICAM 15 MG PO TABS: 15.0000 mg | ORAL_TABLET | Freq: Every day | ORAL | 2 refills | Status: DC

## 2021-10-23 MED ORDER — PANTOPRAZOLE SODIUM 40 MG PO TBEC: 40.0000 mg | DELAYED_RELEASE_TABLET | Freq: Every day | ORAL | 3 refills | Status: DC

## 2021-10-23 MED ORDER — TOPIRAMATE 25 MG PO TABS: 25.0000 mg | ORAL_TABLET | Freq: Three times a day (TID) | ORAL | 3 refills | Status: DC

## 2021-10-23 MED ORDER — BACLOFEN 10 MG PO TABS: 10.0000 mg | ORAL_TABLET | Freq: Three times a day (TID) | ORAL | 1 refills | Status: DC

## 2021-10-23 MED ORDER — ALPRAZOLAM 0.5 MG PO TABS: 0.5000 mg | ORAL_TABLET | Freq: Every evening | ORAL | 1 refills | Status: AC | PRN

## 2021-10-23 NOTE — Progress Notes (Signed)
Established Patient Office Visit  Subjective   Patient ID: Autumn Ortiz, female    DOB: [birth year]  Age: 46 y.o. MRN: [id number]  Chief Complaint  Patient presents with   Follow-up    HPI Pt is a 46 yo female with chronic neck pain due to DDD with tension headaches, GERD, cough with seasonal allergies who presents to the clinic for medication refills.   Continues to struggle with neck pain.    IMPRESSION: Mild degenerative disc disease is noted at C5-6 and C6-7. Mild bilateral neural foraminal stenosis is noted at C5-6 and C6-7 secondary to uncovertebral spurring.  Using muscle relaxers and mobic right now. She has a really good supportive pillow. Some days are better than others. No PT. Continues to have tension headaches and migraines. Needs maxalt for rescue. On topamax for prevention.   Cough is better needs refills on symbicort.   GERD controlled on protonix.   Marland Kitchen. Active Ambulatory Problems    Diagnosis Date Noted   ANXIETY 06/06/2008   ACUTE BRONCHITIS 06/06/2008   ALLERGIC RHINITIS 06/06/2008   ASTHMA 06/06/2008   HEADACHE 06/06/2008   Chest pain varying with breathing 12/19/2020   Shortness of breath 12/19/2020   Chronic cough 12/19/2020   Tachycardia 12/19/2020   Neck pain 02/22/2021   Chronic tension-type headache, intractable 02/22/2021   Post-nasal drip 02/22/2021   Environmental and seasonal allergies 02/22/2021   DDD (degenerative disc disease), cervical 02/22/2021   Leg cramps 03/26/2021   Gastroesophageal reflux disease without esophagitis 03/26/2021   Resolved Ambulatory Problems    Diagnosis Date Noted   No Resolved Ambulatory Problems   Past Medical History:  Diagnosis Date   Anxiety    Asthma    Hypertension    Migraine      ROS See HPI.    Objective:     BP 130/74   Pulse 100   Ht 5\' 3"  (1.6 m)   Wt 203 lb (92.1 kg)   SpO2 99%   BMI 35.96 kg/m  BP Readings from Last 3 Encounters:  10/23/21 130/74  04/23/21 131/78   03/22/21 122/74   Wt Readings from Last 3 Encounters:  10/23/21 203 lb (92.1 kg)  04/23/21 207 lb (93.9 kg)  03/22/21 206 lb (93.4 kg)      Physical Exam Constitutional:      Appearance: Normal appearance. She is obese.  Neck:     Vascular: No carotid bruit.     Comments: Tight cervical paraspinal muscles. No cervical tenderness to palpation over spine.  Cardiovascular:     Rate and Rhythm: Normal rate and regular rhythm.     Pulses: Normal pulses.     Heart sounds: Normal heart sounds.  Pulmonary:     Effort: Pulmonary effort is normal.  Musculoskeletal:     Cervical back: Normal range of motion and neck supple. No rigidity or tenderness.  Lymphadenopathy:     Cervical: No cervical adenopathy.  Neurological:     General: No focal deficit present.     Mental Status: She is alert and oriented to person, place, and time.  Psychiatric:        Mood and Affect: Mood normal.       Assessment & Plan:  Marland KitchenMarland KitchenBrandie was seen today for follow-up.  Diagnoses and all orders for this visit:  DDD (degenerative disc disease), cervical -     meloxicam (MOBIC) 15 MG tablet; Take 1 tablet (15 mg total) by mouth daily. -     Ambulatory referral  to Physical Therapy  Gastroesophageal reflux disease without esophagitis -     pantoprazole (PROTONIX) 40 MG tablet; Take 1 tablet (40 mg total) by mouth daily.  Chronic tension-type headache, intractable -     topiramate (TOPAMAX) 25 MG tablet; Take 1 tablet (25 mg total) by mouth 3 (three) times daily. -     rizatriptan (MAXALT) 10 MG tablet; Take 1 tablet (10 mg total) by mouth as needed for migraine. May repeat in 2 hours if needed -     baclofen (LIORESAL) 10 MG tablet; Take 1 tablet (10 mg total) by mouth 3 (three) times daily. -     meloxicam (MOBIC) 15 MG tablet; Take 1 tablet (15 mg total) by mouth daily. -     Ambulatory referral to Physical Therapy  Neck pain -     baclofen (LIORESAL) 10 MG tablet; Take 1 tablet (10 mg total) by  mouth 3 (three) times daily.  Shortness of breath -     budesonide-formoterol (SYMBICORT) 160-4.5 MCG/ACT inhaler; Inhale 2 puffs into the lungs 2 (two) times daily.  Chronic cough -     budesonide-formoterol (SYMBICORT) 160-4.5 MCG/ACT inhaler; Inhale 2 puffs into the lungs 2 (two) times daily.  Thyroid disorder screening -     TSH  Screening for lipid disorders -     Lipid Panel w/reflex Direct LDL  Screening for diabetes mellitus -     COMPLETE METABOLIC PANEL WITH GFR  Primary hypertension -     lisinopril-hydrochlorothiazide (ZESTORETIC) 10-12.5 MG tablet; Take 1 tablet by mouth daily.  Anxiety -     ALPRAZolam (XANAX) 0.5 MG tablet; Take 1 tablet (0.5 mg total) by mouth at bedtime as needed for anxiety.   Needs screening labs for management.  Continue symbicort for cough Continue zestoretic for BP Xanax only as needed  Discussed cervical DDD and neck pain Referral made to PT Consider massage therapy Consider lyrica/cymbalta/gabapentin/amitriptyline Follow up as needed   Tandy Gaw, PA-C

## 2021-10-23 NOTE — Patient Instructions (Addendum)
Pain relief:  Lyrica/cymbalta/gabapentin/amitriptyline   Massage therapy  Dry needling or accupunture Will refer to PT

## 2021-11-01 ENCOUNTER — Encounter: Payer: Self-pay | Admitting: Physician Assistant

## 2021-11-13 ENCOUNTER — Other Ambulatory Visit: Payer: Self-pay | Admitting: Physician Assistant

## 2021-12-10 ENCOUNTER — Encounter: Payer: BC Managed Care – PPO | Admitting: Physician Assistant

## 2022-02-02 ENCOUNTER — Other Ambulatory Visit: Payer: Self-pay | Admitting: Physician Assistant

## 2022-02-02 DIAGNOSIS — G44221 Chronic tension-type headache, intractable: Secondary | ICD-10-CM

## 2022-02-02 DIAGNOSIS — M5030 Other cervical disc degeneration, unspecified cervical region: Secondary | ICD-10-CM

## 2022-03-11 ENCOUNTER — Encounter: Payer: Self-pay | Admitting: Physician Assistant

## 2022-03-11 ENCOUNTER — Telehealth (INDEPENDENT_AMBULATORY_CARE_PROVIDER_SITE_OTHER): Payer: BC Managed Care – PPO | Admitting: Family Medicine

## 2022-03-11 DIAGNOSIS — U071 COVID-19: Secondary | ICD-10-CM

## 2022-03-11 MED ORDER — NIRMATRELVIR/RITONAVIR (PAXLOVID)TABLET: 3.0000 | ORAL_TABLET | Freq: Two times a day (BID) | ORAL | 0 refills | Status: AC

## 2022-03-11 MED ORDER — BENZONATATE 200 MG PO CAPS: 200.0000 mg | ORAL_CAPSULE | Freq: Two times a day (BID) | ORAL | 0 refills | Status: DC | PRN

## 2022-03-11 NOTE — Progress Notes (Signed)
    Virtual Visit via Video Note  I connected with Autumn Ortiz on 03/11/22 at  1:00 PM EST by a video enabled telemedicine application and verified that I am speaking with the correct person using two identifiers.   I discussed the limitations of evaluation and management by telemedicine and the availability of in person appointments. The patient expressed understanding and agreed to proceed.  Patient location: at home Provider location: in office  Subjective:    CC:  No chief complaint on file.   HPI: Started coughing on Sunday. Overnight started with diarrhea.  Then yesterday ran a high fever of 103.  Cough won't stop. Triggering her migraines. Using her maxalt. No appetite. Diarrhea slowed down.  Triggering her migraines.  Using tylenol cold and Glu. Hx of asthma and BP.  Using her Symbicort.    Past medical history, Surgical history, Family history not pertinant except as noted below, Social history, Allergies, and medications have been entered into the medical record, reviewed, and corrections made.    Objective:    General: Speaking clearly in complete sentences without any shortness of breath.  Alert and oriented x3.  Normal judgment. No apparent acute distress.    Impression and Recommendations:    Problem List Items Addressed This Visit   None Visit Diagnoses     COVID-19    -  Primary   Relevant Medications   nirmatrelvir/ritonavir (PAXLOVID) 20 x 150 MG & 10 x '100MG'$  TABS       COVID 19-we discussed options with her history of hypertension and asthma I did recommend Paxlovid especially since she is been running a high fever and having diarrhea.  Call if not improving over the next week.  Sent over [employer] to help with the cough since the cough is exacerbating her headache.  Appetite is way down but did encourage her to make sure she is staying with hydrated.  Discussed quarantine regulations as well.  No orders of the defined types were placed in  this encounter.   Meds ordered this encounter  Medications   benzonatate (TESSALON) 200 MG capsule    Sig: Take 1 capsule (200 mg total) by mouth 2 (two) times daily as needed for cough.    Dispense:  30 capsule    Refill:  0   nirmatrelvir/ritonavir (PAXLOVID) 20 x 150 MG & 10 x '100MG'$  TABS    Sig: Take 3 tablets by mouth 2 (two) times daily for 5 days. (Take nirmatrelvir 150 mg two tablets twice daily for 5 days and ritonavir 100 mg one tablet twice daily for 5 days) Patient GFR is 77    Dispense:  30 tablet    Refill:  0     I discussed the assessment and treatment plan with the patient. The patient was provided an opportunity to ask questions and all were answered. The patient agreed with the plan and demonstrated an understanding of the instructions.   The patient was advised to call back or seek an in-person evaluation if the symptoms worsen or if the condition fails to improve as anticipated.   Beatrice Lecher, MD

## 2022-03-15 ENCOUNTER — Other Ambulatory Visit: Payer: Self-pay | Admitting: Physician Assistant

## 2022-03-15 DIAGNOSIS — I10 Essential (primary) hypertension: Secondary | ICD-10-CM

## 2022-04-22 ENCOUNTER — Ambulatory Visit (INDEPENDENT_AMBULATORY_CARE_PROVIDER_SITE_OTHER): Payer: BC Managed Care – PPO | Admitting: Physician Assistant

## 2022-04-22 ENCOUNTER — Encounter: Payer: Self-pay | Admitting: Physician Assistant

## 2022-04-22 VITALS — BP 146/81 | HR 71 | Ht 63.0 in | Wt 203.0 lb

## 2022-04-22 DIAGNOSIS — J40 Bronchitis, not specified as acute or chronic: Secondary | ICD-10-CM | POA: Diagnosis not present

## 2022-04-22 DIAGNOSIS — J209 Acute bronchitis, unspecified: Secondary | ICD-10-CM

## 2022-04-22 DIAGNOSIS — J329 Chronic sinusitis, unspecified: Secondary | ICD-10-CM | POA: Diagnosis not present

## 2022-04-22 DIAGNOSIS — Z8616 Personal history of COVID-19: Secondary | ICD-10-CM | POA: Diagnosis not present

## 2022-04-22 DIAGNOSIS — J4521 Mild intermittent asthma with (acute) exacerbation: Secondary | ICD-10-CM | POA: Diagnosis not present

## 2022-04-22 MED ORDER — IPRATROPIUM-ALBUTEROL 0.5-2.5 (3) MG/3ML IN SOLN: 3.0000 mL | Freq: Once | RESPIRATORY_TRACT | Status: DC

## 2022-04-22 MED ORDER — IPRATROPIUM-ALBUTEROL 0.5-2.5 (3) MG/3ML IN SOLN: 3.0000 mL | RESPIRATORY_TRACT | 0 refills | Status: DC | PRN

## 2022-04-22 MED ORDER — PREDNISONE 20 MG PO TABS: ORAL_TABLET | ORAL | 0 refills | Status: DC

## 2022-04-22 MED ORDER — AZITHROMYCIN 250 MG PO TABS: ORAL_TABLET | ORAL | 0 refills | Status: DC

## 2022-04-22 NOTE — Progress Notes (Unsigned)
Established Patient Office Visit  Subjective   Patient ID: Autumn Ortiz, female    DOB: 1975-10-06  Age: 47 y.o. MRN: [id number]  Chief Complaint  Patient presents with   Cough    HPI Pt is a 47 yo obese female with ongoing covid symptoms since 3/5 when she had a virtual visit for covid. PMH for asthma and allergies. She is not on any daily inhalers.  She could not tolerate paxlovid due to severe nausea and metal taste. She has continued OTC treatment plan for cough, congestion, sinus drainage but she continues to cough. Cough is dry and with little production. Her chest is tight. Denies any leg pain or swelling. She is a little short of breath but has continued to work. Her sinus drainage goes into her chest.   .. Active Ambulatory Problems    Diagnosis Date Noted   ANXIETY 06/06/2008   ALLERGIC RHINITIS 06/06/2008   ASTHMA 06/06/2008   HEADACHE 06/06/2008   Chest pain varying with breathing 12/19/2020   Shortness of breath 12/19/2020   Chronic cough 12/19/2020   Tachycardia 12/19/2020   Neck pain 02/22/2021   Chronic tension-type headache, intractable 02/22/2021   Post-nasal drip 02/22/2021   Environmental and seasonal allergies 02/22/2021   DDD (degenerative disc disease), cervical 02/22/2021   Leg cramps 03/26/2021   Gastroesophageal reflux disease without esophagitis 03/26/2021   Mild intermittent asthma with exacerbation 04/23/2022   History of COVID-19 04/23/2022   Resolved Ambulatory Problems    Diagnosis Date Noted   Acute bronchitis 06/06/2008   Past Medical History:  Diagnosis Date   Anxiety    Asthma    Hypertension    Migraine      ROS   See HPI.  Objective:     BP (!) 146/81   Pulse 71   Ht  (1.6 m)   Wt 203 lb (92.1 kg)   SpO2 99%   BMI 35.96 kg/m  BP Readings from Last 3 Encounters:  04/22/22 (!) 146/81  10/23/21 130/74  04/23/21 131/78   Wt Readings from Last 3 Encounters:  04/22/22 203 lb (92.1 kg)  10/23/21 203 lb (92.1  kg)  04/23/21 207 lb (93.9 kg)    Breathing duoneb treatment given in office  Physical Exam Constitutional:      Appearance: Normal appearance. She is obese.  HENT:     Head: Normocephalic.     Right Ear: Tympanic membrane, ear canal and external ear normal. There is no impacted cerumen.     Left Ear: Tympanic membrane, ear canal and external ear normal. There is no impacted cerumen.     Nose: Congestion present.     Mouth/Throat:     Mouth: Mucous membranes are moist.     Pharynx: Posterior oropharyngeal erythema present. No oropharyngeal exudate.  Eyes:     General:        Right eye: No discharge.        Left eye: No discharge.     Extraocular Movements: Extraocular movements intact.     Conjunctiva/sclera: Conjunctivae normal.     Pupils: Pupils are equal, round, and reactive to light.  Neck:     Vascular: No carotid bruit.  Cardiovascular:     Rate and Rhythm: Normal rate and regular rhythm.     Pulses: Normal pulses.  Pulmonary:     Comments: No air movement initially After nebulizer treatment air movement heard with rhonchi and wheezing bilaterally Musculoskeletal:        General:  No swelling.     Cervical back: Normal range of motion and neck supple. No tenderness.     Right lower leg: No edema.     Left lower leg: No edema.  Lymphadenopathy:     Cervical: Cervical adenopathy present.  Neurological:     General: No focal deficit present.     Mental Status: She is alert and oriented to person, place, and time.  Psychiatric:        Mood and Affect: Mood normal.         Assessment & Plan:  Marland KitchenMarland KitchenBrandie was seen today for cough.  Diagnoses and all orders for this visit:  Sinobronchitis -     ipratropium-albuterol (DUONEB) 0.5-2.5 (3) MG/3ML nebulizer solution 3 mL -     predniSONE (DELTASONE) 20 MG tablet; Take 3 tablets for 3 days, take 2 tablets for 3 days, take 1 tablet for 3 days, take 1/2 tablet for 4 days. -     azithromycin (ZITHROMAX Z-PAK) 250 MG  tablet; Take 2 tablets (500 mg) on  Day 1,  followed by 1 tablet (250 mg) once daily on Days 2 through 5. -     ipratropium-albuterol (DUONEB) 0.5-2.5 (3) MG/3ML SOLN; Take 3 mLs by nebulization every 4 (four) hours as needed.  History of COVID-19 -     ipratropium-albuterol (DUONEB) 0.5-2.5 (3) MG/3ML nebulizer solution 3 mL -     predniSONE (DELTASONE) 20 MG tablet; Take 3 tablets for 3 days, take 2 tablets for 3 days, take 1 tablet for 3 days, take 1/2 tablet for 4 days. -     azithromycin (ZITHROMAX Z-PAK) 250 MG tablet; Take 2 tablets (500 mg) on  Day 1,  followed by 1 tablet (250 mg) once daily on Days 2 through 5. -     ipratropium-albuterol (DUONEB) 0.5-2.5 (3) MG/3ML SOLN; Take 3 mLs by nebulization every 4 (four) hours as needed.  Mild intermittent asthma with exacerbation   Post covid secondary infection and inflammation and asthma exacerbation Pt felt better after nebulizer treatment and lungs moved O2 much better Pulse ox great Continue duoneb ever 4 hours as needed Start prednisone and zpak Rest and hydrate No other signs of PE/DVT-no leg pain/swelling/tachycardia/hypoxia If SOB continues or worsens please follow up   Tandy Gaw, PA-C

## 2022-04-23 ENCOUNTER — Encounter: Payer: Self-pay | Admitting: Physician Assistant

## 2022-04-23 DIAGNOSIS — Z8616 Personal history of COVID-19: Secondary | ICD-10-CM | POA: Insufficient documentation

## 2022-04-23 DIAGNOSIS — J4521 Mild intermittent asthma with (acute) exacerbation: Secondary | ICD-10-CM | POA: Insufficient documentation

## 2022-04-25 ENCOUNTER — Ambulatory Visit: Payer: BC Managed Care – PPO | Admitting: Physician Assistant

## 2022-05-19 ENCOUNTER — Other Ambulatory Visit: Payer: Self-pay | Admitting: Physician Assistant

## 2022-05-19 DIAGNOSIS — Z8616 Personal history of COVID-19: Secondary | ICD-10-CM

## 2022-05-19 DIAGNOSIS — J329 Chronic sinusitis, unspecified: Secondary | ICD-10-CM

## 2022-06-24 LAB — HM PAP SMEAR: HM Pap smear: NEGATIVE

## 2022-07-18 ENCOUNTER — Other Ambulatory Visit: Payer: Self-pay | Admitting: Physician Assistant

## 2022-07-18 ENCOUNTER — Encounter: Payer: Self-pay | Admitting: Physician Assistant

## 2022-07-18 DIAGNOSIS — I10 Essential (primary) hypertension: Secondary | ICD-10-CM

## 2022-10-19 ENCOUNTER — Other Ambulatory Visit: Payer: Self-pay | Admitting: Physician Assistant

## 2022-10-19 DIAGNOSIS — I10 Essential (primary) hypertension: Secondary | ICD-10-CM

## 2022-11-18 ENCOUNTER — Other Ambulatory Visit: Payer: Self-pay | Admitting: Physician Assistant

## 2022-11-18 DIAGNOSIS — G44221 Chronic tension-type headache, intractable: Secondary | ICD-10-CM

## 2022-12-16 ENCOUNTER — Other Ambulatory Visit: Payer: Self-pay | Admitting: Physician Assistant

## 2022-12-16 DIAGNOSIS — K219 Gastro-esophageal reflux disease without esophagitis: Secondary | ICD-10-CM

## 2022-12-17 ENCOUNTER — Encounter: Payer: Self-pay | Admitting: Physician Assistant

## 2022-12-17 ENCOUNTER — Ambulatory Visit: Payer: BC Managed Care – PPO

## 2022-12-17 ENCOUNTER — Ambulatory Visit (INDEPENDENT_AMBULATORY_CARE_PROVIDER_SITE_OTHER): Payer: BC Managed Care – PPO | Admitting: Physician Assistant

## 2022-12-17 VITALS — BP 145/87 | HR 80 | Resp 14 | Wt 201.1 lb

## 2022-12-17 DIAGNOSIS — M25511 Pain in right shoulder: Secondary | ICD-10-CM

## 2022-12-17 DIAGNOSIS — M25571 Pain in right ankle and joints of right foot: Secondary | ICD-10-CM | POA: Diagnosis not present

## 2022-12-17 DIAGNOSIS — G8929 Other chronic pain: Secondary | ICD-10-CM | POA: Diagnosis not present

## 2022-12-17 DIAGNOSIS — S99911A Unspecified injury of right ankle, initial encounter: Secondary | ICD-10-CM | POA: Diagnosis not present

## 2022-12-17 DIAGNOSIS — M5030 Other cervical disc degeneration, unspecified cervical region: Secondary | ICD-10-CM

## 2022-12-17 DIAGNOSIS — I493 Ventricular premature depolarization: Secondary | ICD-10-CM

## 2022-12-17 DIAGNOSIS — R000 Tachycardia, unspecified: Secondary | ICD-10-CM

## 2022-12-17 DIAGNOSIS — G44221 Chronic tension-type headache, intractable: Secondary | ICD-10-CM

## 2022-12-17 MED ORDER — MELOXICAM 15 MG PO TABS: 15.0000 mg | ORAL_TABLET | Freq: Every day | ORAL | 1 refills | Status: DC

## 2022-12-17 MED ORDER — TRAMADOL HCL 50 MG PO TABS: 50.0000 mg | ORAL_TABLET | Freq: Four times a day (QID) | ORAL | 0 refills | Status: AC | PRN

## 2022-12-17 NOTE — Progress Notes (Signed)
Negative xray. Will order MRI.
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
## 2022-12-17 NOTE — Patient Instructions (Addendum)
Will order heart monitor Start exercises for shoulder Get xrays Continue mobic Tramadol for breakthrough pain   Premature Ventricular Contraction  A premature ventricular contraction (PVC) is a type of irregular heartbeat (arrhythmia). The heart has four chambers, including the upper chambers (atria) and lower chambers (ventricles). Normally, an electrical signal starts in a group of cells called the sinoatrial node (SA node) and travels through the atria, causing them to pump blood into the ventricles. During a PVC, the heartbeat starts in one of the lower ventricles. This may cause the heartbeat to be shorter and less effective. In most cases, PVCs come and go and do not require treatment. What are the causes? Common causes of the condition include: Heart attack or coronary artery disease (CAD). Heart valve problems. Heart surgery. Infection of the heart (myocarditis). Inflammation of the heart. In many cases, the cause of this condition is not known. What increases the risk? The following factors may make you more likely to develop this condition: Age, especially being over age 19. Being female. An imbalance of salts and minerals in the body (electrolytes). Low blood oxygen levels or high carbon dioxide levels. Certain medicines, including over-the-counter and prescribed medicines. High blood pressure. Obesity. Episodes may be triggered by: Vigorous exercise. Tobacco, alcohol, or caffeine use. Illegal drug use. Emotional stress. Poor or irregular sleep. What are the signs or symptoms? The main symptoms of this condition are fast or irregular heartbeats (palpitations) or the feeling of a pause in the heartbeat. Other symptoms include: Shortness of breath. Difficulty exercising. Chest pain. Feeling tired. Dizziness. In some cases, there are no symptoms. How is this diagnosed? This condition may be diagnosed based on: Your medical history or symptoms. A physical exam. Your  health care provider may listen to your heart. Tests, such as: Blood tests. An ECG (electrocardiogram) to monitor the electrical activity of your heart. An ambulatory cardiac monitor that records your heartbeats for 24 hours or more. Stress tests to see how exercise affects your heart rhythm and blood supply. An echocardiogram, which creates an image of your heart. An electrophysiology study (EPS) to check for electrical problems in your heart. How is this treated? Treatment for this condition depends on any underlying conditions, the type of PVC, how many PVCs you have had, and if the symptoms are affecting your daily life. Possible treatments include: Avoiding things that cause PVCs (triggers). These include caffeine, tobacco, and alcohol. Taking medicines if symptoms are severe or if the arrhythmias happen a lot. Getting treatment for underlying conditions that cause PVCs. Having an implantable cardioverter defibrillator (ICD) placed in the chest to monitor the heartbeat. The monitor sends a shock to the heart if it senses an arrhythmia and brings the heartbeat back to normal. Having a catheter ablation procedure to destroy the part of the heart tissue that sends abnormal signals. In many cases, no treatment is required. Follow these instructions at home: Lifestyle  Do not use any products that contain nicotine or tobacco. These products include cigarettes, chewing tobacco, and vaping devices, such as e-cigarettes. If you need help quitting, ask your health care provider. Do not use illegal drugs. Exercise regularly. Ask your health care provider what type of exercise is safe for you. Try to get at least 7-9 hours of sleep each night. Find healthy ways to manage stress. Avoid stressful situations when possible. Alcohol use Do not drink alcohol if: Your health care provider tells you not to drink. You are pregnant, may be pregnant, or are planning  to become pregnant. Alcohol triggers  your episodes. If you drink alcohol: Limit how much you have to: 0-1 drink a day for women. 0-2 drinks a day for men. Know how much alcohol is in your drink. In the [place] one drink equals one 12 oz bottle of beer (355 mL), one 5 oz glass of wine (148 mL), or one 1 oz glass of hard liquor (44 mL). General instructions Take over-the-counter and prescription medicines only as told by your health care provider. If caffeine triggers episodes of PVC, do not eat, drink, or use anything with caffeine in it. Contact a health care provider if: You feel palpitations often. You have nausea and vomiting. Get help right away if: You have chest pain. You have trouble breathing. You start sweating for no reason. You become light-headed or you faint. These symptoms may be an emergency. Get help right away. Call 911. Do not wait to see if the symptoms will go away. Do not drive yourself to the hospital. This information is not intended to replace advice given to you by your health care provider. Make sure you discuss any questions you have with your health care provider. Document Revised: 05/24/2021 Document Reviewed: 05/24/2021 Elsevier Patient Education  2024 [employer].

## 2022-12-17 NOTE — Progress Notes (Signed)
No fracture.   Continue to treat like sprain. Wear ankle brace, ice at least twice a day, take mobic. Do your ABCs with feet once daily for rehab. Stay in ankle brace for at least 4 weeks and longer if ankle does not feel stable.

## 2022-12-18 LAB — CMP14+EGFR
ALT: 24 [IU]/L (ref 0–32)
AST: 21 [IU]/L (ref 0–40)
Albumin: 4.3 g/dL (ref 3.9–4.9)
Alkaline Phosphatase: 85 [IU]/L (ref 44–121)
BUN/Creatinine Ratio: 17 (ref 9–23)
BUN: 13 mg/dL (ref 6–24)
Bilirubin Total: <0.2 mg/dL (ref 0.0–1.2)
CO2: 20 mmol/L (ref 20–29)
Calcium: 9.4 mg/dL (ref 8.7–10.2)
Chloride: 102 mmol/L (ref 96–106)
Creatinine, Ser: 0.75 mg/dL (ref 0.57–1.00)
Globulin, Total: 3.4 g/dL (ref 1.5–4.5)
Glucose: 83 mg/dL (ref 70–99)
Potassium: 3.7 mmol/L (ref 3.5–5.2)
Sodium: 138 mmol/L (ref 134–144)
Total Protein: 7.7 g/dL (ref 6.0–8.5)
eGFR: 98 mL/min/{1.73_m2} (ref >59)

## 2022-12-18 LAB — CBC WITH DIFFERENTIAL/PLATELET
Basophils Absolute: 0 10*3/uL (ref 0.0–0.2)
Basos: 0 %
EOS (ABSOLUTE): 0.3 10*3/uL (ref 0.0–0.4)
Eos: 4 %
Hematocrit: 43.5 % (ref 34.0–46.6)
Hemoglobin: 14.6 g/dL (ref 11.1–15.9)
Immature Grans (Abs): 0 10*3/uL (ref 0.0–0.1)
Immature Granulocytes: 0 %
Lymphocytes Absolute: 2.2 10*3/uL (ref 0.7–3.1)
Lymphs: 31 %
MCH: 30.7 pg (ref 26.6–33.0)
MCHC: 33.6 g/dL (ref 31.5–35.7)
MCV: 92 fL (ref 79–97)
Monocytes Absolute: 0.4 10*3/uL (ref 0.1–0.9)
Monocytes: 6 %
Neutrophils Absolute: 4.2 10*3/uL (ref 1.4–7.0)
Neutrophils: 59 %
Platelets: 322 10*3/uL (ref 150–450)
RBC: 4.75 x10E6/uL (ref 3.77–5.28)
RDW: 13.2 % (ref 11.7–15.4)
WBC: 7.2 10*3/uL (ref 3.4–10.8)

## 2022-12-18 LAB — TSH+FREE T4
Free T4: 1.08 ng/dL (ref 0.82–1.77)
TSH: 1.61 u[IU]/mL (ref 0.450–4.500)

## 2022-12-19 ENCOUNTER — Ambulatory Visit: Payer: BC Managed Care – PPO | Attending: Physician Assistant

## 2022-12-19 ENCOUNTER — Encounter: Payer: Self-pay | Admitting: Physician Assistant

## 2022-12-19 DIAGNOSIS — I493 Ventricular premature depolarization: Secondary | ICD-10-CM | POA: Insufficient documentation

## 2022-12-19 DIAGNOSIS — R000 Tachycardia, unspecified: Secondary | ICD-10-CM

## 2022-12-19 NOTE — Progress Notes (Signed)
Autumn Ortiz,   Thyroid looks good.  No anemia.  WBC looks good.  Kidney, liver, glucose looks good!

## 2022-12-19 NOTE — Progress Notes (Unsigned)
EP to read

## 2022-12-27 DIAGNOSIS — R000 Tachycardia, unspecified: Secondary | ICD-10-CM | POA: Diagnosis not present

## 2022-12-27 DIAGNOSIS — I493 Ventricular premature depolarization: Secondary | ICD-10-CM | POA: Diagnosis not present

## 2023-01-05 ENCOUNTER — Encounter: Payer: Self-pay | Admitting: Physician Assistant

## 2023-01-21 ENCOUNTER — Encounter: Payer: Self-pay | Admitting: Physician Assistant

## 2023-01-21 ENCOUNTER — Ambulatory Visit (INDEPENDENT_AMBULATORY_CARE_PROVIDER_SITE_OTHER): Payer: BC Managed Care – PPO | Admitting: Physician Assistant

## 2023-01-21 VITALS — BP 141/71 | HR 71 | Temp 98.3°F | Ht 63.0 in | Wt 202.0 lb

## 2023-01-21 DIAGNOSIS — M25511 Pain in right shoulder: Secondary | ICD-10-CM

## 2023-01-21 DIAGNOSIS — G8929 Other chronic pain: Secondary | ICD-10-CM | POA: Diagnosis not present

## 2023-01-21 DIAGNOSIS — I493 Ventricular premature depolarization: Secondary | ICD-10-CM | POA: Diagnosis not present

## 2023-01-21 DIAGNOSIS — M7711 Lateral epicondylitis, right elbow: Secondary | ICD-10-CM | POA: Diagnosis not present

## 2023-01-21 MED ORDER — METHYLPREDNISOLONE ACETATE 40 MG/ML IJ SUSP
40.0000 mg | Freq: Once | INTRAMUSCULAR | Status: AC
  Administered 2023-01-21: 40 mg via INTRAMUSCULAR

## 2023-01-21 MED ORDER — DICLOFENAC SODIUM 1 % EX GEL: 4.0000 g | Freq: Four times a day (QID) | CUTANEOUS | 1 refills | Status: AC

## 2023-01-21 MED ORDER — LIDOCAINE HCL (PF) 1 % IJ SOLN
9.0000 mL | Freq: Once | INTRAMUSCULAR | Status: AC
  Administered 2023-01-21: 9 mL

## 2023-01-21 NOTE — Patient Instructions (Signed)
 Tennis Elbow Rehab Ask your health care provider which exercises are safe for you. Do exercises exactly as told by your health care provider and adjust them as directed. It is normal to feel mild stretching, pulling, tightness, or discomfort as you do these exercises. Stop right away if you feel sudden pain or your pain gets worse. Do not begin these exercises until told by your health care provider. Stretching and range-of-motion exercises These exercises warm up your muscles and joints and improve the movement and flexibility of your elbow. Wrist flexion, assisted  Straighten your left / right elbow in front of you with your palm facing down toward the floor. If told by your health care provider, bend your left / right elbow to a 90-degree angle (right angle) at your side instead of holding it straight. With your other hand, gently push over the back of your left / right hand so your fingers point toward the floor (flexion). Stop when you feel a gentle stretch on the back of your forearm. Hold this position for __________ seconds. Repeat __________ times. Complete this exercise __________ times a day. Wrist extension, assisted  Straighten your left / right elbow in front of you with your palm facing up toward the ceiling. If told by your health care provider, bend your left / right elbow to a 90-degree angle (right angle) at your side instead of holding it straight. With your other hand, gently pull your left / right hand and fingers toward the floor (extension). Stop when you feel a gentle stretch on the palm side of your forearm. Hold this position for __________ seconds. Repeat __________ times. Complete this exercise __________ times a day. Assisted forearm rotation, supination Sit or stand with your elbows at your side. Bend your left / right elbow to a 90-degree angle (right angle). Using your uninjured hand, turn your left / right palm up toward the ceiling (supination) until you feel a  gentle stretch along the inside of your forearm. Hold this position for __________ seconds. Repeat __________ times. Complete this exercise __________ times a day. Assisted forearm rotation, pronation Sit or stand with your elbows at your side. Bend your left / right elbow to a 90-degree angle (right angle). Using your uninjured hand, turn your left / right palm down toward the floor (pronation) until you feel a gentle stretch along the outside of your forearm. Hold this position for __________ seconds. Repeat __________ times. Complete this exercise __________ times a day. Strengthening exercises These exercises build strength and endurance in your forearm and elbow. Endurance is the ability to use your muscles for a long time, even after they get tired. Radial deviation  Stand with a __________ weight or a hammer in your left / right hand. Or, sit while holding a rubber exercise band or tubing, with your left / right forearm supported on a table or countertop. Position your forearm so that the thumb is facing the ceiling, as if you are going to clap your hands. This is the neutral position. Raise your hand upward in front of you so your thumb moves toward the ceiling (radial deviation), or pull up on the rubber tubing. Keep your forearm and elbow still while you move your wrist only. Hold this position for __________ seconds. Slowly return to the starting position. Repeat __________ times. Complete this exercise __________ times a day. Wrist extension, eccentric Sit with your left / right forearm palm-down and supported on a table or other surface. Let your left /  right wrist extend over the edge of the surface. Hold a __________ weight or a piece of exercise band or tubing in your left / right hand. If using a rubber exercise band or tubing, hold the other end of the tubing with your other hand. Use your uninjured hand to move your left / right hand up toward the ceiling. Take your  uninjured hand away and slowly return to the starting position using only your left / right hand. Lowering your arm under tension is called eccentric extension. Repeat __________ times. Complete this exercise __________ times a day. Wrist extension Do not do this exercise if it causes pain at the outside of your elbow. Only do this exercise once instructed by your health care provider. Sit with your left / right forearm supported on a table or other surface and your palm turned down toward the floor. Let your left / right wrist extend over the edge of the surface. Hold a __________ weight or a piece of rubber exercise band or tubing. If you are using a rubber exercise band or tubing, hold the band or tubing in place with your other hand to provide resistance. Slowly bend your wrist so your hand moves up toward the ceiling (extension). Move only your wrist, keeping your forearm and elbow still. Hold this position for __________ seconds. Slowly return to the starting position. Repeat __________ times. Complete this exercise __________ times a day. Forearm rotation, supination To do this exercise, you will need a lightweight hammer or rubber mallet. Sit with your left / right forearm supported on a table or other surface. Bend your elbow to a 90-degree angle (right angle). Position your forearm so that your palm is facing down toward the floor, with your hand resting over the edge of the table. Hold a hammer in your left / right hand. To make this exercise easier, hold the hammer near the head of the hammer. To make this exercise harder, hold the hammer near the end of the handle. Without moving your wrist or elbow, slowly rotate your forearm so your palm faces up toward the ceiling (supination). Hold this position for __________ seconds. Slowly return to the starting position. Repeat __________ times. Complete this exercise __________ times a day. Shoulder blade squeeze Sit in a stable chair or  stand with good posture. If you are sitting down, do not let your back touch the back of the chair. Your arms should be at your sides with your elbows bent to a 90-degree angle (right angle). Position your forearms so that your thumbs are facing the ceiling (neutral position). Without lifting your shoulders up, squeeze your shoulder blades tightly together. Hold this position for __________ seconds. Slowly release and return to the starting position. Repeat __________ times. Complete this exercise __________ times a day. This information is not intended to replace advice given to you by your health care provider. Make sure you discuss any questions you have with your health care provider. Document Revised: 03/14/2019 Document Reviewed: 03/16/2019 Elsevier Patient Education  2024 [employer]   Premature Ventricular Contraction  A premature ventricular contraction (PVC) is a type of irregular heartbeat (arrhythmia). The heart has four chambers, including the upper chambers (atria) and lower chambers (ventricles). Normally, an electrical signal starts in a group of cells called the sinoatrial node (SA node) and travels through the atria, causing them to pump blood into the ventricles. During a PVC, the heartbeat starts in one of the lower ventricles. This may cause the heartbeat  to be shorter and less effective. In most cases, PVCs come and go and do not require treatment. What are the causes? Common causes of the condition include: Heart attack or coronary artery disease (CAD). Heart valve problems. Heart surgery. Infection of the heart (myocarditis). Inflammation of the heart. In many cases, the cause of this condition is not known. What increases the risk? The following factors may make you more likely to develop this condition: Age, especially being over age 42. Being female. An imbalance of salts and minerals in the body (electrolytes). Low blood oxygen levels or high carbon dioxide  levels. Certain medicines, including over-the-counter and prescribed medicines. High blood pressure. Obesity. Episodes may be triggered by: Vigorous exercise. Tobacco, alcohol, or caffeine use. Illegal drug use. Emotional stress. Poor or irregular sleep. What are the signs or symptoms? The main symptoms of this condition are fast or irregular heartbeats (palpitations) or the feeling of a pause in the heartbeat. Other symptoms include: Shortness of breath. Difficulty exercising. Chest pain. Feeling tired. Dizziness. In some cases, there are no symptoms. How is this diagnosed? This condition may be diagnosed based on: Your medical history or symptoms. A physical exam. Your health care provider may listen to your heart. Tests, such as: Blood tests. An ECG (electrocardiogram) to monitor the electrical activity of your heart. An ambulatory cardiac monitor that records your heartbeats for 24 hours or more. Stress tests to see how exercise affects your heart rhythm and blood supply. An echocardiogram, which creates an image of your heart. An electrophysiology study (EPS) to check for electrical problems in your heart. How is this treated? Treatment for this condition depends on any underlying conditions, the type of PVC, how many PVCs you have had, and if the symptoms are affecting your daily life. Possible treatments include: Avoiding things that cause PVCs (triggers). These include caffeine, tobacco, and alcohol. Taking medicines if symptoms are severe or if the arrhythmias happen a lot. Getting treatment for underlying conditions that cause PVCs. Having an implantable cardioverter defibrillator (ICD) placed in the chest to monitor the heartbeat. The monitor sends a shock to the heart if it senses an arrhythmia and brings the heartbeat back to normal. Having a catheter ablation procedure to destroy the part of the heart tissue that sends abnormal signals. In many cases, no treatment  is required. Follow these instructions at home: Lifestyle  Do not use any products that contain nicotine or tobacco. These products include cigarettes, chewing tobacco, and vaping devices, such as e-cigarettes. If you need help quitting, ask your health care provider. Do not use illegal drugs. Exercise regularly. Ask your health care provider what type of exercise is safe for you. Try to get at least 7-9 hours of sleep each night. Find healthy ways to manage stress. Avoid stressful situations when possible. Alcohol use Do not drink alcohol if: Your health care provider tells you not to drink. You are pregnant, may be pregnant, or are planning to become pregnant. Alcohol triggers your episodes. If you drink alcohol: Limit how much you have to: 0-1 drink a day for women. 0-2 drinks a day for men. Know how much alcohol is in your drink. In the [place] one drink equals one 12 oz bottle of beer (355 mL), one 5 oz glass of wine (148 mL), or one 1 oz glass of hard liquor (44 mL). General instructions Take over-the-counter and prescription medicines only as told by your health care provider. If caffeine triggers episodes of PVC, do not eat,  drink, or use anything with caffeine in it. Contact a health care provider if: You feel palpitations often. You have nausea and vomiting. Get help right away if: You have chest pain. You have trouble breathing. You start sweating for no reason. You become light-headed or you faint. These symptoms may be an emergency. Get help right away. Call 911. Do not wait to see if the symptoms will go away. Do not drive yourself to the hospital. This information is not intended to replace advice given to you by your health care provider. Make sure you discuss any questions you have with your health care provider. Document Revised: 05/24/2021 Document Reviewed: 05/24/2021 Elsevier Patient Education  2024 [employer].

## 2023-01-23 ENCOUNTER — Encounter: Payer: Self-pay | Admitting: Physician Assistant

## 2023-01-23 ENCOUNTER — Other Ambulatory Visit: Payer: Self-pay | Admitting: Physician Assistant

## 2023-01-23 DIAGNOSIS — M7711 Lateral epicondylitis, right elbow: Secondary | ICD-10-CM | POA: Insufficient documentation

## 2023-01-23 DIAGNOSIS — I10 Essential (primary) hypertension: Secondary | ICD-10-CM
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
## 2023-01-26 ENCOUNTER — Encounter: Payer: Self-pay | Admitting: Physician Assistant

## 2023-01-28 ENCOUNTER — Ambulatory Visit (INDEPENDENT_AMBULATORY_CARE_PROVIDER_SITE_OTHER): Payer: BC Managed Care – PPO | Admitting: Physician Assistant

## 2023-01-28 VITALS — BP 137/74 | HR 75 | Ht 63.0 in | Wt 201.0 lb

## 2023-01-28 DIAGNOSIS — Z13220 Encounter for screening for lipoid disorders: Secondary | ICD-10-CM

## 2023-01-28 DIAGNOSIS — Z6835 Body mass index (BMI) 35.0-35.9, adult: Secondary | ICD-10-CM

## 2023-01-28 DIAGNOSIS — E66812 Obesity, class 2: Secondary | ICD-10-CM

## 2023-01-28 DIAGNOSIS — E6609 Other obesity due to excess calories: Secondary | ICD-10-CM

## 2023-01-28 DIAGNOSIS — Z0000 Encounter for general adult medical examination without abnormal findings: Secondary | ICD-10-CM | POA: Diagnosis not present

## 2023-01-28 DIAGNOSIS — Z23 Encounter for immunization: Secondary | ICD-10-CM

## 2023-01-28 DIAGNOSIS — Z131 Encounter for screening for diabetes mellitus: Secondary | ICD-10-CM

## 2023-01-28 NOTE — Patient Instructions (Signed)
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
## 2023-01-29 ENCOUNTER — Encounter: Payer: Self-pay | Admitting: Physician Assistant

## 2023-01-29 LAB — LIPID PANEL
Chol/HDL Ratio: 4 {ratio} (ref 0.0–4.4)
Cholesterol, Total: 276 mg/dL — ABNORMAL HIGH (ref 100–199)
HDL: 69 mg/dL (ref >39)
LDL Chol Calc (NIH): 178 mg/dL — ABNORMAL HIGH (ref 0–99)
Triglycerides: 160 mg/dL — ABNORMAL HIGH (ref 0–149)
VLDL Cholesterol Cal: 29 mg/dL (ref 5–40)

## 2023-01-29 LAB — SPECIMEN STATUS REPORT

## 2023-01-29 LAB — B12 AND FOLATE PANEL
Folate: 3.3 ng/mL (ref >3.0)
Vitamin B-12: 278 pg/mL (ref 232–1245)

## 2023-01-29 LAB — VITAMIN D 25 HYDROXY (VIT D DEFICIENCY, FRACTURES): Vit D, 25-Hydroxy: 17.2 ng/mL — ABNORMAL LOW (ref 30.0–100.0)

## 2023-01-29 NOTE — Progress Notes (Signed)
Autumn Ortiz,   Vitamin D is really low. Make sure taking vitamin D with diary daily increase by 2000 units.   HDL, good cholesterol, looks great.  LDL, bad cholesterol, not to goal.   10 year cardiovascular risk is 2 percent and low. Continue working on diet and exercise. Rechec in 1 year.   Marland Kitchen.The 10-year ASCVD risk score (Arnett DK, et al., 2019) is: 2%   Values used to calculate the score:     Age: 48 years     Sex: Female     Is Non-Hispanic African American: No     Diabetic: No     Tobacco smoker: No     Systolic Blood Pressure: 137 mmHg     Is BP treated: Yes     HDL Cholesterol: 69 mg/dL     Total Cholesterol: 276 mg/dL

## 2023-01-30 ENCOUNTER — Encounter: Payer: Self-pay | Admitting: Physician Assistant

## 2023-01-30 DIAGNOSIS — E66812 Obesity, class 2: Secondary | ICD-10-CM | POA: Insufficient documentation

## 2023-02-15 IMAGING — CT CT ANGIO CHEST
3 of 9 series · 18 of 36 positions shown · IV contrast (omnipaque)
Comparison: None.

CLINICAL DATA: Positive D-dimer.  Pulmonary embolism suspected.

EXAM:
CT ANGIOGRAPHY CHEST WITH CONTRAST
TECHNIQUE: Multidetector CT imaging of the chest was performed using the
standard protocol during bolus administration of intravenous
contrast. Multiplanar CT image reconstructions and MIPs were
obtained to evaluate the vascular anatomy.
CONTRAST:  100mL OMNIPAQUE IOHEXOL 350 MG/ML SOLN

[Series 5: pe lung · axial · 0.56mm/px · z∈[-256,-82]mm · 4 of 146 slices shown]
[im 30/146  mediastinal]
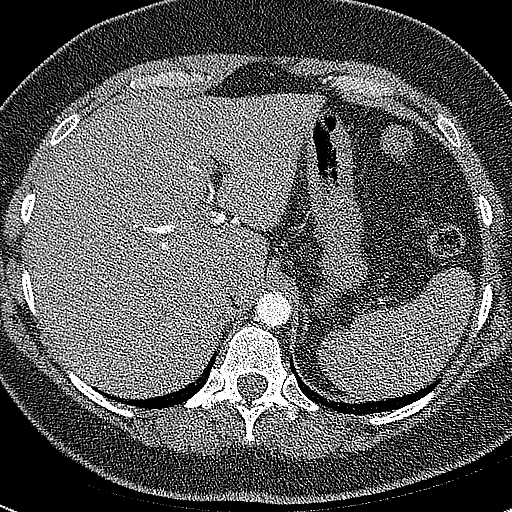
[im 59/146  mediastinal]
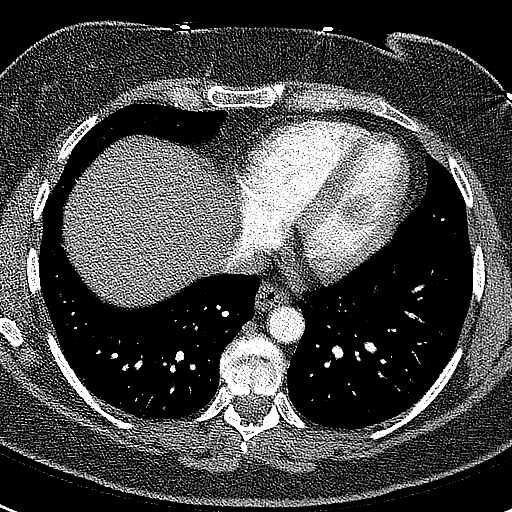
[im 88/146  mediastinal]
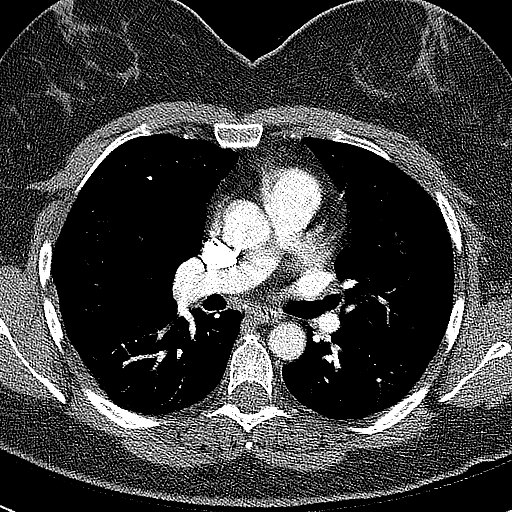
[im 117/146  mediastinal]
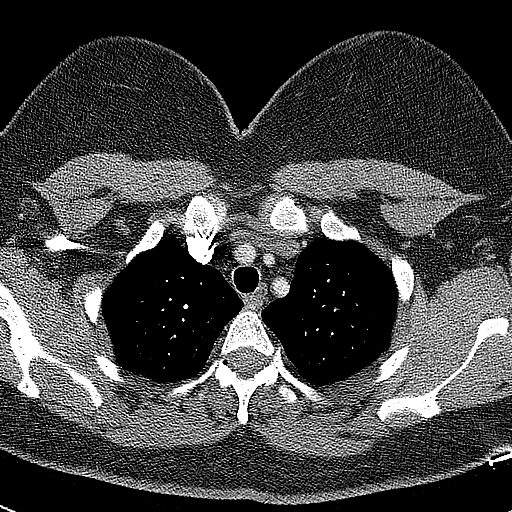

[Series 6: pe coronal mpr · coronal · 0.60mm/px · 1 of 110 slices shown]
[im 55/110  mediastinal]
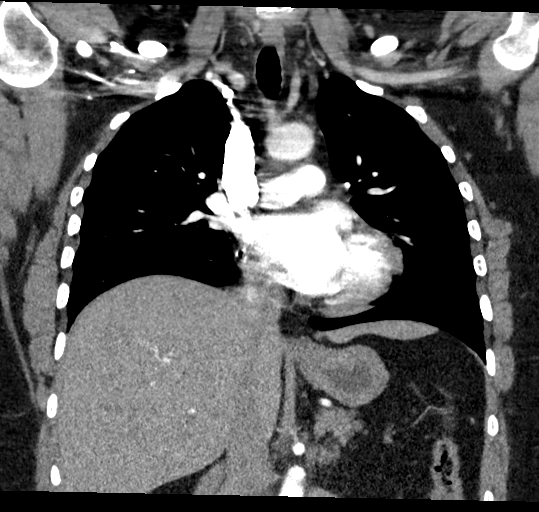

[Series 10: pe thins · axial · 0.56mm/px · z∈[-294,-44]mm · 13 of 365 slices shown]
[im 27/365  lung]
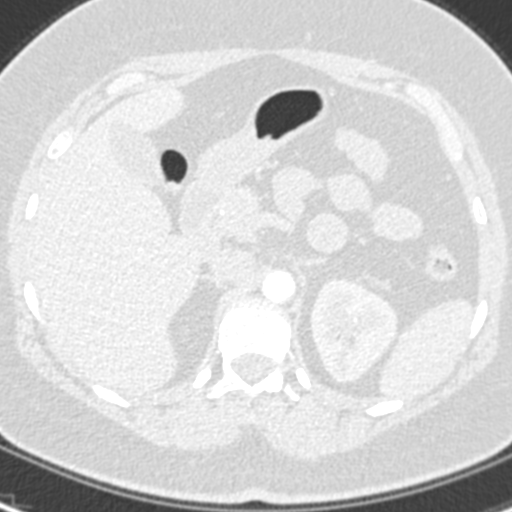
[im 53/365  mediastinal]
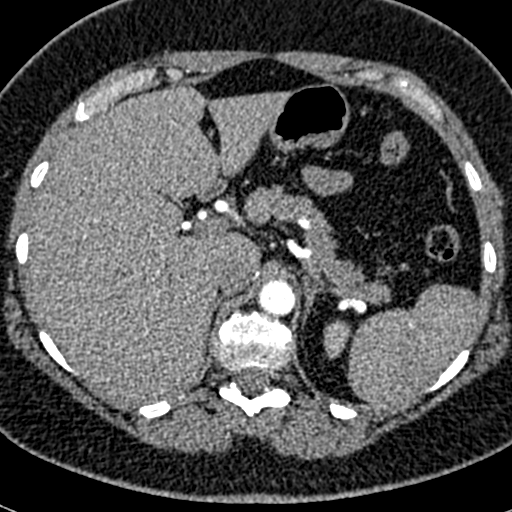
[im 79/365  lung]
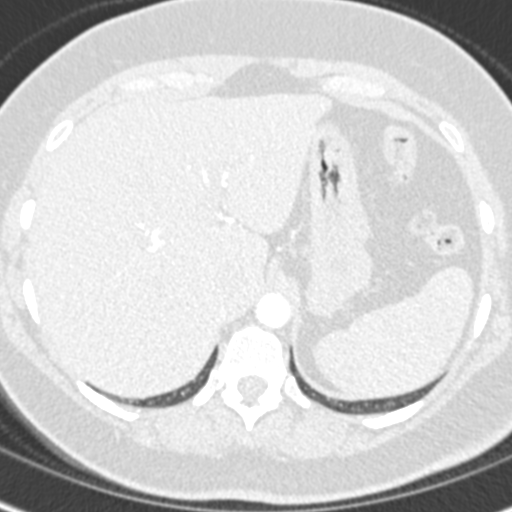
[im 105/365  mediastinal]
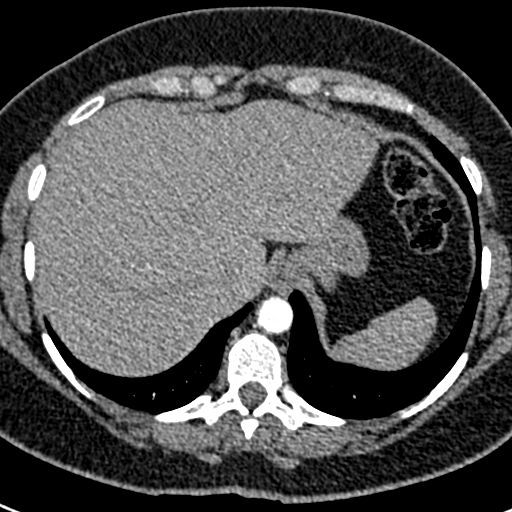
[im 131/365  lung]
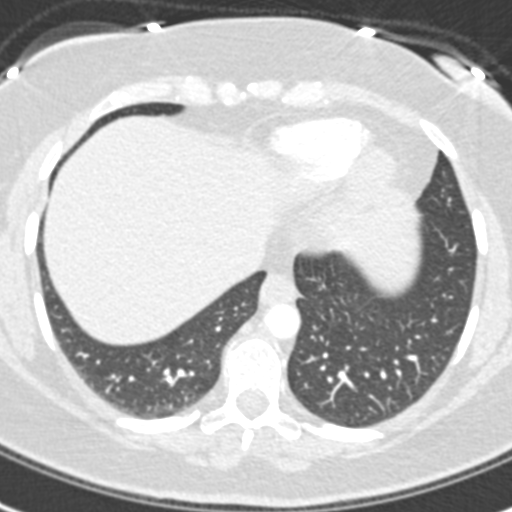
[im 157/365  mediastinal]
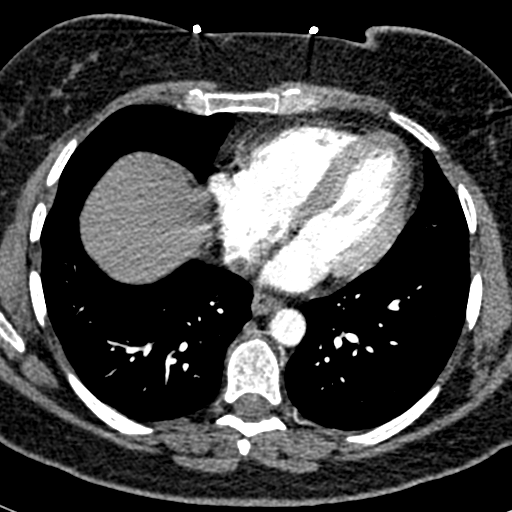
[im 183/365  lung]
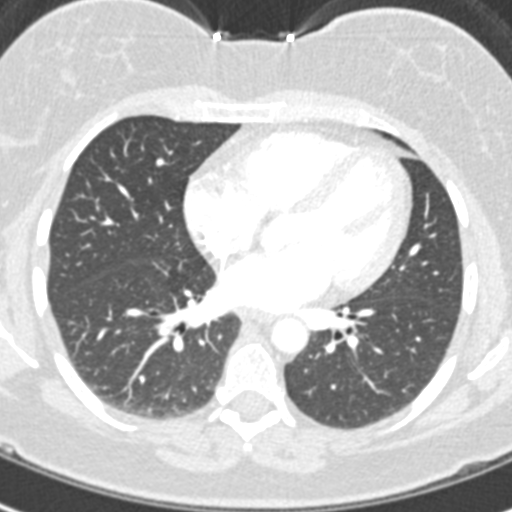
[im 209/365  mediastinal]
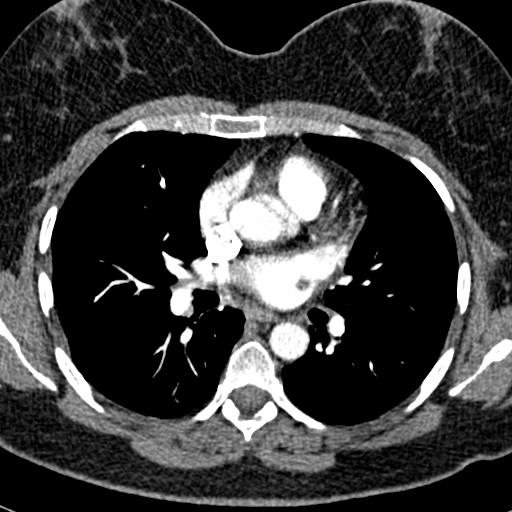
[im 235/365  lung]
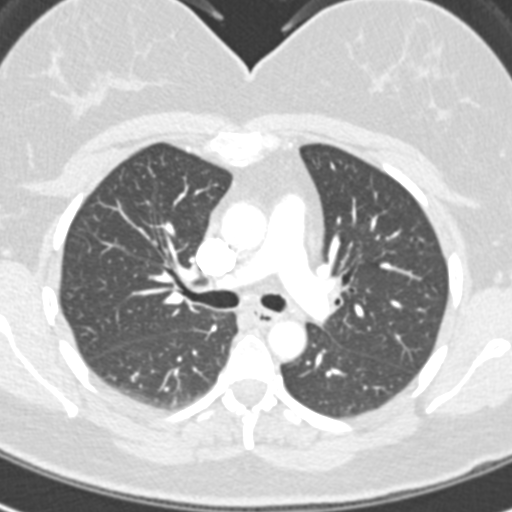
[im 261/365  mediastinal]
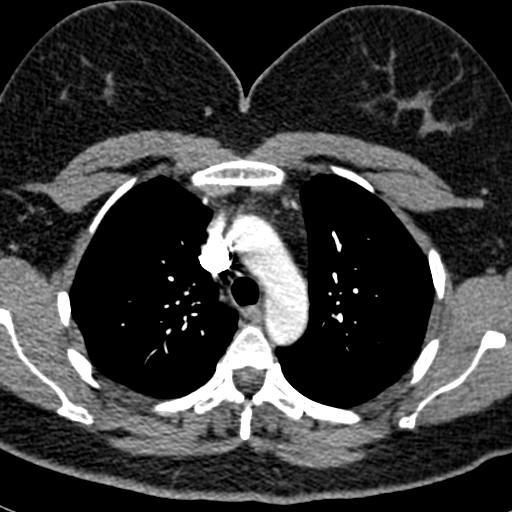
[im 287/365  lung]
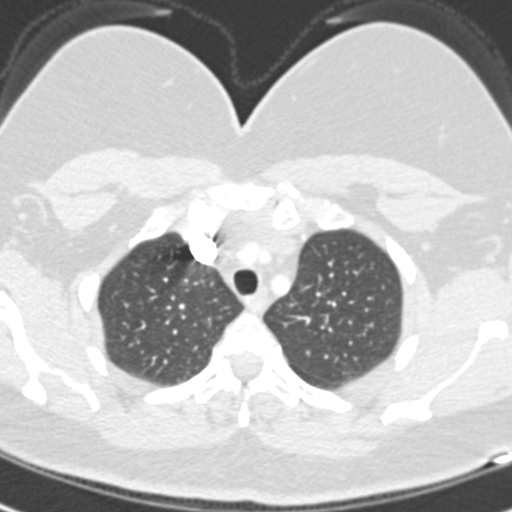
[im 313/365  mediastinal]
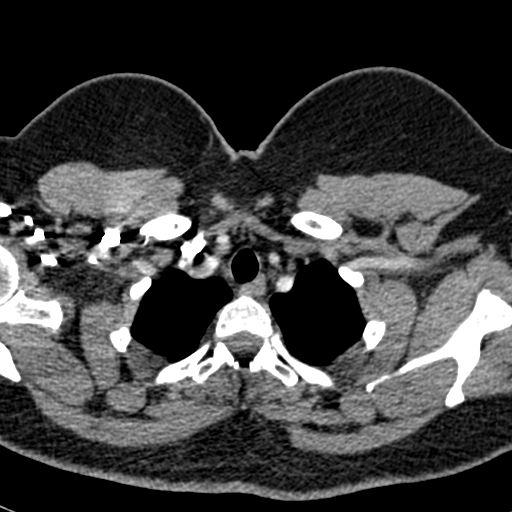
[im 339/365  lung]
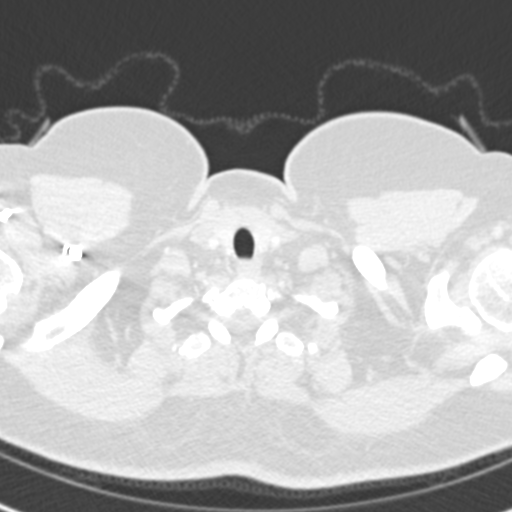

[18 of 36 positions shown; findings below may reference images not displayed]

FINDINGS: Cardiovascular: No filling defects within the pulmonary arteries to
suggest acute pulmonary embolism. No significant vascular findings.
Normal heart size. No pericardial effusion.

Mediastinum/Nodes: No axillary or supraclavicular adenopathy. No
mediastinal or hilar adenopathy. No pericardial fluid. Esophagus
normal.

Lungs/Pleura: No suspicious pulmonary nodules. Normal pleural.
Airways normal.

Upper Abdomen: Limited view of the liver, kidneys, pancreas are
unremarkable. Normal adrenal glands.

Musculoskeletal: No aggressive osseous lesion.

Review of the MIP images confirms the above findings.
IMPRESSION: 1. No acute pulmonary embolism.
2. No pulmonary parenchymal

## 2023-03-23 ENCOUNTER — Other Ambulatory Visit: Payer: Self-pay | Admitting: Physician Assistant

## 2023-03-23 DIAGNOSIS — M542 Cervicalgia: Secondary | ICD-10-CM

## 2023-03-23 DIAGNOSIS — G44221 Chronic tension-type headache, intractable: Secondary | ICD-10-CM

## 2023-04-30 ENCOUNTER — Other Ambulatory Visit: Payer: Self-pay | Admitting: Physician Assistant

## 2023-04-30 DIAGNOSIS — I10 Essential (primary) hypertension: Secondary | ICD-10-CM

## 2023-06-17 ENCOUNTER — Other Ambulatory Visit: Payer: Self-pay | Admitting: Physician Assistant

## 2023-06-17 DIAGNOSIS — G44221 Chronic tension-type headache, intractable: Secondary | ICD-10-CM

## 2023-06-17 DIAGNOSIS — M5030 Other cervical disc degeneration, unspecified cervical region: Secondary | ICD-10-CM

## 2023-07-29 ENCOUNTER — Ambulatory Visit: Payer: BC Managed Care – PPO | Admitting: Physician Assistant

## 2023-07-29 ENCOUNTER — Encounter: Payer: Self-pay | Admitting: Physician Assistant

## 2023-07-29 VITALS — BP 144/93 | HR 79 | Ht 63.0 in | Wt 206.0 lb

## 2023-07-29 DIAGNOSIS — Z6836 Body mass index (BMI) 36.0-36.9, adult: Secondary | ICD-10-CM

## 2023-07-29 DIAGNOSIS — R140 Abdominal distension (gaseous): Secondary | ICD-10-CM

## 2023-07-29 DIAGNOSIS — M25511 Pain in right shoulder: Secondary | ICD-10-CM | POA: Diagnosis not present

## 2023-07-29 DIAGNOSIS — M7711 Lateral epicondylitis, right elbow: Secondary | ICD-10-CM

## 2023-07-29 DIAGNOSIS — E66812 Obesity, class 2: Secondary | ICD-10-CM

## 2023-07-29 DIAGNOSIS — I10 Essential (primary) hypertension: Secondary | ICD-10-CM | POA: Diagnosis not present

## 2023-07-29 DIAGNOSIS — E6609 Other obesity due to excess calories: Secondary | ICD-10-CM

## 2023-07-29 DIAGNOSIS — E559 Vitamin D deficiency, unspecified: Secondary | ICD-10-CM

## 2023-07-29 DIAGNOSIS — G8929 Other chronic pain: Secondary | ICD-10-CM

## 2023-07-29 DIAGNOSIS — I493 Ventricular premature depolarization: Secondary | ICD-10-CM

## 2023-07-29 DIAGNOSIS — E538 Deficiency of other specified B group vitamins: Secondary | ICD-10-CM
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
## 2023-07-31 ENCOUNTER — Ambulatory Visit: Payer: Self-pay | Admitting: Family Medicine

## 2023-07-31 NOTE — Progress Notes (Signed)
 Hi Brandi, I am covering for [facility] while she is out of the office.  Your B12 has dipped again and it looked better 6 months ago.  Not sure if you were taking a supplement and recently stopped it but I would encourage you to restart.  Your folate looks good though.  Glucose was a little elevated but I was not sure if you were fasting when you had your labs drawn.  In regards to the food antibodies that she tested you have quite a few that are showing that you do have some increased sensitivities to these foods.  Again this is not technically an allergy but is a food sensitivity.  I usually recommend starting with the top 3 or 4 highest ones to completely removed from your diet and then work your way down the list.  If you remove the biggest culprits and you noticed a big improvement in how you are feeling then you do not have to worry as much about the low sensitivity once.  For you I would recommend avoiding paprika/sweet pepper.  This is a very common spice so we will have to do a lot of close reading of labels.  I would also completely stop coffee and moldy cheeses moldy cheeses are things like blue cheese, or Grongonzola, etc. I would also avoid products that have yeast.  So in your case I would probably completely cut out bread products at least for the short-term because in that case it would cover the yeast, the wheat the rye and some of the oat products all at 1 time.  Your vitamin D  looks much better.  Test for celiac so far is negative.  I would recommend avoiding those specific ones for at least a month to 6 weeks to see if you feel a lot better if there is not a big change in how you are feeling then you can kind of go to the next group of foods which would be corn, onion, eggs and even pork.

## 2023-08-01 ENCOUNTER — Other Ambulatory Visit: Payer: Self-pay | Admitting: Physician Assistant

## 2023-08-01 DIAGNOSIS — I10 Essential (primary) hypertension: Secondary | ICD-10-CM

## 2023-08-02 LAB — IGG ALLERGENS (22) FOODS
Casein IgG: <2 ug/mL (ref 0.0–1.9)
Cheese, Mold Type IgG: 6.4 ug/mL — ABNORMAL HIGH (ref 0.0–1.9)
Chicken IgG: <2 ug/mL (ref 0.0–1.9)
Chocolate/Cacao IgG: 2.7 ug/mL — ABNORMAL HIGH (ref 0.0–1.9)
Coffee IgG: 7.6 ug/mL — ABNORMAL HIGH (ref 0.0–1.9)
Corn IgG: 3.7 ug/mL — ABNORMAL HIGH (ref 0.0–1.9)
Egg, Whole IgG: 5 ug/mL — ABNORMAL HIGH (ref 0.0–1.9)
F218-IgG Paprika/Sweet Pepper: 15.8 ug/mL — ABNORMAL HIGH (ref 0.0–1.9)
Green Bean IgG: <2 ug/mL (ref 0.0–1.9)
Haddock IgG: <2 ug/mL (ref 0.0–1.9)
Lamb IgG: 2.6 ug/mL — ABNORMAL HIGH (ref 0.0–1.9)
Oat IgG: 4.6 ug/mL — ABNORMAL HIGH (ref 0.0–1.9)
Onion IgG: 4 ug/mL — ABNORMAL HIGH (ref 0.0–1.9)
Peanut IgG: <2 ug/mL (ref 0.0–1.9)
Pork IgG: 2.4 ug/mL — ABNORMAL HIGH (ref 0.0–1.9)
Potato, White, IgG: <2 ug/mL (ref 0.0–1.9)
Rye IgG: 2 ug/mL — ABNORMAL HIGH (ref 0.0–1.9)
Shrimp IgG: <2 ug/mL (ref 0.0–1.9)
Soybean IgG: <2 ug/mL (ref 0.0–1.9)
Tomato IgG: <2 ug/mL (ref 0.0–1.9)
Wheat IgG: 2 ug/mL — ABNORMAL HIGH (ref 0.0–1.9)
Yeast IgG: 4.9 ug/mL — ABNORMAL HIGH (ref 0.0–1.9)

## 2023-08-02 LAB — GLIA (IGA/G) + TTG IGA
Antigliadin Abs, IgA: 7 U (ref 0–19)
Gliadin IgG: 1 U (ref 0–19)
Transglutaminase IgA: <2 U/mL (ref 0–3)

## 2023-08-02 LAB — VITAMIN D 25 HYDROXY (VIT D DEFICIENCY, FRACTURES): Vit D, 25-Hydroxy: 36.7 ng/mL (ref 30.0–100.0)

## 2023-08-02 LAB — CMP14+EGFR
ALT: 14 IU/L (ref 0–32)
AST: 17 IU/L (ref 0–40)
Albumin: 3.8 g/dL — ABNORMAL LOW (ref 3.9–4.9)
Alkaline Phosphatase: 74 IU/L (ref 44–121)
BUN/Creatinine Ratio: 18 (ref 9–23)
BUN: 14 mg/dL (ref 6–24)
Bilirubin Total: <0.2 mg/dL (ref 0.0–1.2)
CO2: 16 mmol/L — ABNORMAL LOW (ref 20–29)
Calcium: 9 mg/dL (ref 8.7–10.2)
Chloride: 107 mmol/L — ABNORMAL HIGH (ref 96–106)
Creatinine, Ser: 0.8 mg/dL (ref 0.57–1.00)
Globulin, Total: 3.3 g/dL (ref 1.5–4.5)
Glucose: 123 mg/dL — ABNORMAL HIGH (ref 70–99)
Potassium: 3.8 mmol/L (ref 3.5–5.2)
Sodium: 140 mmol/L (ref 134–144)
Total Protein: 7.1 g/dL (ref 6.0–8.5)
eGFR: 90 mL/min/1.73 (ref >59)

## 2023-08-02 LAB — B12 AND FOLATE PANEL
Folate: 6.6 ng/mL (ref >3.0)
Vitamin B-12: 212 pg/mL — ABNORMAL LOW (ref 232–1245)

## 2023-08-03 ENCOUNTER — Encounter: Payer: Self-pay | Admitting: Physician Assistant

## 2023-08-03 DIAGNOSIS — E559 Vitamin D deficiency, unspecified: Secondary | ICD-10-CM | POA: Insufficient documentation

## 2023-08-03 DIAGNOSIS — E538 Deficiency of other specified B group vitamins: Secondary | ICD-10-CM | POA: Insufficient documentation

## 2023-08-03 DIAGNOSIS — R140 Abdominal distension (gaseous): Secondary | ICD-10-CM | POA: Insufficient documentation

## 2023-08-06 ENCOUNTER — Ambulatory Visit

## 2023-08-06 ENCOUNTER — Ambulatory Visit: Admitting: Sports Medicine

## 2023-08-06 DIAGNOSIS — M5412 Radiculopathy, cervical region: Secondary | ICD-10-CM

## 2023-08-06 MED ORDER — PREDNISONE 50 MG PO TABS: ORAL_TABLET | ORAL | 0 refills | Status: DC

## 2023-08-06 MED ORDER — GABAPENTIN 300 MG PO CAPS: ORAL_CAPSULE | ORAL | 3 refills | Status: DC

## 2023-08-06 NOTE — Assessment & Plan Note (Signed)
 This is a very pleasant 48 year old female, known cervical DDD based on x-rays from December 2020. She recalls walking her dog back in October 2024, the dog bolted pulling her right arm directly out laterally. She had immediate pain in her neck, numbness and tingling rating down the arm to the hand. Since then she has had continued discomfort, she localizes the primary area right trapezius at the base of the neck. Occasional radicular symptoms although difficult to tell numbness and tingling due to being on topiramate . No red flag symptoms. She really does not have any impingement signs however she does have a positive Spurling sign today on the right resulting in concordant discomfort. Adding 5 days of prednisone , gabapentin , x-rays, formal physical therapy. Return to see me in 6 weeks, MR for interventional planning if not better.

## 2023-08-06 NOTE — Progress Notes (Signed)
    Procedures performed today:    None.  Independent interpretation of notes and tests performed by another provider:   None.  Brief History, Exam, Impression, and Recommendations:    Radiculitis of right cervical region This is a very pleasant 48 year old female, known cervical DDD based on x-rays from December 2020. She recalls walking her dog back in October 2024, the dog bolted pulling her right arm directly out laterally. She had immediate pain in her neck, numbness and tingling rating down the arm to the hand. Since then she has had continued discomfort, she localizes the primary area right trapezius at the base of the neck. Occasional radicular symptoms although difficult to tell numbness and tingling due to being on topiramate . No red flag symptoms. She really does not have any impingement signs however she does have a positive Spurling sign today on the right resulting in concordant discomfort. Adding 5 days of prednisone , gabapentin , x-rays, formal physical therapy. Return to see me in 6 weeks, MR for interventional planning if not better.    ____________________________________________ Debby PARAS. Curtis, M.D., ABFM., CAQSM., AME. Primary Care and Sports Medicine [facility]  Adjunct Professor of [facility] Medicine  [employer]  Restaurant manager, fast food

## 2023-08-16 ENCOUNTER — Ambulatory Visit: Payer: Self-pay | Admitting: Sports Medicine

## 2023-08-26 ENCOUNTER — Encounter (INDEPENDENT_AMBULATORY_CARE_PROVIDER_SITE_OTHER): Payer: Self-pay | Admitting: Sports Medicine

## 2023-08-26 DIAGNOSIS — M5030 Other cervical disc degeneration, unspecified cervical region: Secondary | ICD-10-CM

## 2023-08-26 DIAGNOSIS — M5412 Radiculopathy, cervical region: Secondary | ICD-10-CM

## 2023-08-26 NOTE — Telephone Encounter (Signed)

## 2023-08-27 MED ORDER — PREDNISONE 10 MG (48) PO TBPK: ORAL_TABLET | Freq: Every day | ORAL | 0 refills | Status: DC

## 2023-08-27 MED ORDER — TRAMADOL HCL 50 MG PO TABS: 50.0000 mg | ORAL_TABLET | Freq: Three times a day (TID) | ORAL | 0 refills | Status: AC | PRN

## 2023-08-27 NOTE — Addendum Note (Signed)
 Addended by: CURTIS DEBBY PARAS on: 08/27/2023 11:55 AM   Modules accepted: Orders

## 2023-08-27 NOTE — Telephone Encounter (Deleted)

## 2023-09-08 ENCOUNTER — Encounter: Payer: Self-pay | Admitting: Sports Medicine

## 2023-09-23 ENCOUNTER — Other Ambulatory Visit: Payer: Self-pay | Admitting: Physician Assistant

## 2023-09-23 ENCOUNTER — Ambulatory Visit: Admitting: Sports Medicine

## 2023-09-23 DIAGNOSIS — M542 Cervicalgia: Secondary | ICD-10-CM

## 2023-09-23 DIAGNOSIS — G44221 Chronic tension-type headache, intractable: Secondary | ICD-10-CM

## 2023-11-14 ENCOUNTER — Other Ambulatory Visit: Payer: Self-pay | Admitting: Physician Assistant

## 2023-11-14 DIAGNOSIS — G44221 Chronic tension-type headache, intractable: Secondary | ICD-10-CM

## 2023-11-18 ENCOUNTER — Encounter: Payer: Self-pay | Admitting: Physician Assistant

## 2023-11-18 ENCOUNTER — Ambulatory Visit: Payer: Self-pay

## 2023-11-18 ENCOUNTER — Telehealth: Payer: Self-pay

## 2023-11-18 ENCOUNTER — Ambulatory Visit: Admitting: Physician Assistant

## 2023-11-18 VITALS — BP 136/74 | HR 94 | Ht 63.0 in | Wt 206.0 lb

## 2023-11-18 DIAGNOSIS — M5412 Radiculopathy, cervical region: Secondary | ICD-10-CM | POA: Diagnosis not present

## 2023-11-18 DIAGNOSIS — M542 Cervicalgia: Secondary | ICD-10-CM

## 2023-11-18 DIAGNOSIS — M5030 Other cervical disc degeneration, unspecified cervical region: Secondary | ICD-10-CM

## 2023-11-18 MED ORDER — PREGABALIN 75 MG PO CAPS: 75.0000 mg | ORAL_CAPSULE | Freq: Two times a day (BID) | ORAL | 1 refills | Status: AC

## 2023-11-18 MED ORDER — HYDROCODONE-ACETAMINOPHEN 5-325 MG PO TABS: 1.0000 | ORAL_TABLET | Freq: Four times a day (QID) | ORAL | 0 refills | Status: AC | PRN

## 2023-11-18 NOTE — Telephone Encounter (Signed)
 FYI Only or Action Required?: FYI only for provider: appointment scheduled on 11/18/23.  Patient was last seen in primary care on 08/06/2023 by Curtis Debby PARAS, MD.  Called Nurse Triage reporting Numbness.  Symptoms began several weeks ago.  Interventions attempted: Rest, hydration, or home remedies.  Symptoms are: gradually worsening.  Triage Disposition: See PCP When Office is Open (Within 3 Days)  Patient/caregiver understands and will follow disposition?: Yes Reason for Disposition  [1] Numbness or tingling in one or both feet AND [2] is a chronic symptom (recurrent or ongoing AND present > 4 weeks)  Answer Assessment - Initial Assessment Questions Chronic radiculitis since 2024 (see OV note from July). States she is experiencing arm numbness x 3 weeks. Also reports chronic back and neck pain. Is doing PT. Also reports HR occasionally increases to 102 and then drops back down spontaneously.   1. SYMPTOM: What is the main symptom you are concerned about? (e.Ortiz., weakness, numbness)     Pain and numbness  2. ONSET: When did this start? (e.Ortiz., minutes, hours, days; while sleeping)     Chronic, worse 3 weeks  3. PATTERN Does this come and go, or has it been constant since it started?  Is it present now?     Comes and goes  Protocols used: Neurologic [facility] Copied from CRM [id number]. Topic: Clinical - Red Word Triage >> Nov 18, 2023  9:14 AM Autumn Ortiz wrote: [facility] that prompted transfer to Nurse Triage: F/u on neck injury.Autumn Ortiz arms are numb ( both left and right with tingles ).. please check heart Past Medical History:  Diagnosis Date   Anxiety    Asthma    Hypertension    Migraine

## 2023-11-18 NOTE — Telephone Encounter (Signed)
 Prescription sent in today.

## 2023-11-18 NOTE — Telephone Encounter (Signed)
 Copied from CRM [id number]. Topic: Clinical - Medication Question >> Nov 18, 2023  9:11 AM Emylou G wrote: Reason for CRM: Patient called checking status of  Topiramate  (25 mg Oral 3 times daily).SABRA been waiting since the 8th and can't be off of it.  Currently out?

## 2023-11-18 NOTE — Patient Instructions (Signed)
 MRI to order Norco as needed for pain Stop gabapentin  and start lyrica twice a day  Cervical Radiculopathy  Cervical radiculopathy means that a nerve in the neck (a cervical nerve) is pinched or bruised. This can happen because of an injury to the cervical spine (vertebrae) in the neck, or as a normal part of getting older. This condition can cause pain or loss of feeling (numbness) that runs from your neck all the way down to your arm and fingers. Often, this condition gets better with rest. Treatment may be needed if the condition does not get better. What are the causes? A neck injury. A bulging disk in your spine. Sudden muscle tightening (muscle spasms). Tight muscles in your neck due to overuse. Arthritis. Breakdown in the bones and joints of the spine (spondylosis) due to getting older. Bone spurs that form near the nerves in the neck. What are the signs or symptoms? Pain. The pain may: Run from the neck to the arm and hand. Be very bad or irritating. Get worse when you move your neck. Loss of feeling or tingling in your arm or hand. Weakness in your arm or hand, in very bad cases. How is this treated? In many cases, treatment is not needed for this condition. With rest, the condition often gets better over time. If treatment is needed, options may include: Wearing a soft neck collar (cervical collar) for short periods of time. Doing exercises (physical therapy) to strengthen your neck muscles. Taking medicines. Having shots (injections) in your spine, in very bad cases. Having surgery. This may be needed if other treatments do not help. The type of surgery that is used will depend on the cause of your condition. Follow these instructions at home: If you have a soft neck collar: Wear it as told by your doctor. Take it off only as told by your doctor. Ask your doctor if you can take the collar off for cleaning and bathing. If you are allowed to take the collar off for cleaning  or bathing: Follow instructions from your doctor about how to take off the collar safely. Clean the collar by wiping it with mild soap and water and drying it completely. Take out any removable pads in the collar every 1-2 days. Wash them by hand with soap and water. Let them air-dry completely before you put them back in the collar. Check your skin under the collar for redness or sores. If you see any, tell your doctor. Managing pain     Take over-the-counter and prescription medicines only as told by your doctor. If told, put ice on the painful area. To do this: If you have a soft neck collar, take if off as told by your doctor. Put ice in a plastic bag. Place a towel between your skin and the bag. Leave the ice on for 20 minutes, 2-3 times a day. Take off the ice if your skin turns bright red. This is very important. If you cannot feel pain, heat, or cold, you have a greater risk of damage to the area. If using ice does not help, you can try using heat. Use the heat source that your doctor recommends, such as a moist heat pack or a heating pad. Place a towel between your skin and the heat source. Leave the heat on for 20-30 minutes. Take off the heat if your skin turns bright red. This is very important. If you cannot feel pain, heat, or cold, you have a greater risk of  getting burned. You may try a gentle neck and shoulder rub (massage). Activity Rest as needed. Return to your normal activities when your doctor says that it is safe. Do exercises as told by your doctor or physical therapist. You may have to avoid lifting. Ask your doctor how much you can safely lift. General instructions Use a flat pillow when you sleep. Do not drive while wearing a soft neck collar. If you do not have a soft neck collar, ask your doctor if it is safe to drive while your neck heals. Ask your doctor if you should avoid driving or using machines while you are taking your medicine. Do not smoke or use  any products that contain nicotine or tobacco. If you need help quitting, ask your doctor. Keep all follow-up visits. Contact a doctor if: Your condition does not get better with treatment. Get help right away if: Your pain gets worse and medicine does not help. You lose feeling or feel weak in your hand, arm, face, or leg. You have a high fever. Your neck is stiff. You cannot control when you poop or pee (have incontinence). You have trouble with walking, balance, or talking. Summary Cervical radiculopathy means that a nerve in the neck is pinched or bruised. A nerve can get pinched from a bulging disk, arthritis, an injury to the neck, or other causes. Symptoms include pain, tingling, or loss of feeling that goes from the neck to the arm or hand. Weakness in your arm or hand can happen in very bad cases. Treatment may include resting, wearing a soft neck collar, and doing exercises. You might need to take medicines for pain. In very bad cases, shots or surgery may be needed. This information is not intended to replace advice given to you by your health care provider. Make sure you discuss any questions you have with your health care provider. Document Revised: 06/28/2020 Document Reviewed: 06/28/2020 Elsevier Patient Education  2024 [employer].

## 2023-11-19 ENCOUNTER — Telehealth: Payer: Self-pay

## 2023-11-19 NOTE — Telephone Encounter (Signed)
 E-faxed [employer] [facility] [place]  chart notes they are requested from date 07/29/23-07/29/23

## 2023-11-20 ENCOUNTER — Encounter: Payer: Self-pay | Admitting: Physician Assistant

## 2023-11-20 NOTE — Progress Notes (Signed)
 Established Patient Office Visit  Subjective   Patient ID: Autumn Ortiz, female    DOB: 02-08-1975  Age: 48 y.o. MRN: [id number]  Chief Complaint  Patient presents with   Extremity Weakness    Bilateral arm weakness and numbness    HPI .SABRADiscussed the use of AI scribe software for clinical note transcription with the patient, who gave verbal consent to proceed.  History of Present Illness Autumn Ortiz is a 48 year old female who presents with left-sided neck pain and arm symptoms.  Cervical pain and musculoskeletal symptoms for months - has seen Dr. ONEIDA, sports medicine, referred for PT and given gabapentin , prednisone , tramadol  - Significant left-sided neck pain with a persistent knot that swells and exhibits movement during needling treatments - Pain present when picking up objects - Painful knot in the left arm - Improved cervical range of motion per previous clinician - Unable to attend needling sessions as frequently as needed due to insurance coverage limitations - gabapentin  makes too sleepy to take during the day - tramadol  does not seem to help with pain  Upper extremity paresthesia and weakness - Bilateral arm symptoms during sleep - Inability to hold the steering wheel with the left arm while driving - Sensation in the fingers and entire left arm similar to an arm 'going to sleep' - No tingling in the arm, but numbness-like sensation affecting the whole arm  Sleep disturbance - Poor sleep for the past three weeks - Gabapentin  taken once daily at night due to sedative effects, but no improvement in sleep  Imaging and diagnostic barriers - No MRI performed due to claustrophobia - Seeking an open MRI option - Needs time to coordinate transportation to MRI appointment     ROS See HPI.    Objective:     BP 136/74   Pulse 94   Ht 5' 3 (1.6 m)   Wt 206 lb (93.4 kg)   SpO2 99%   BMI 36.49 kg/m  BP Readings from Last 3 Encounters:  11/18/23  136/74  07/29/23 (!) 144/93  01/28/23 137/74   Wt Readings from Last 3 Encounters:  11/18/23 206 lb (93.4 kg)  07/29/23 206 lb (93.4 kg)  01/28/23 201 lb (91.2 kg)      Physical Exam Constitutional:      Appearance: Normal appearance. She is obese.  HENT:     Head: Normocephalic.  Cardiovascular:     Rate and Rhythm: Normal rate.  Pulmonary:     Effort: Pulmonary effort is normal.  Musculoskeletal:     Comments: Bilateral hand grip decreased 4/5.  Left sided upper arm weakness 3/5.  Pain with ROM of neck and bilateral shoulders. Tenderness and tightness over bilateral cervical paraspinal muscles.  Tenderness to palpation over cervical spine.  Tenderness to palpation over left lateral epicondyle.   Neurological:     Mental Status: She is alert and oriented to person, place, and time.      The 10-year ASCVD risk score (Arnett DK, et al., 2019) is: 2%    Assessment & Plan:  SABRASABRABrandie was seen today for extremity weakness.  Diagnoses and all orders for this visit:  Cervical radiculopathy -     HYDROcodone-acetaminophen (NORCO/VICODIN) 5-325 MG tablet; Take 1 tablet by mouth every 6 (six) hours as needed for up to 5 days for moderate pain (pain score 4-6). -     pregabalin (LYRICA) 75 MG capsule; Take 1 capsule (75 mg total) by mouth 2 (two) times daily. -  MR Cervical Spine Wo Contrast; Future  DDD (degenerative disc disease), cervical -     HYDROcodone-acetaminophen (NORCO/VICODIN) 5-325 MG tablet; Take 1 tablet by mouth every 6 (six) hours as needed for up to 5 days for moderate pain (pain score 4-6). -     pregabalin (LYRICA) 75 MG capsule; Take 1 capsule (75 mg total) by mouth 2 (two) times daily. -     MR Cervical Spine Wo Contrast; Future  Cervicalgia -     HYDROcodone-acetaminophen (NORCO/VICODIN) 5-325 MG tablet; Take 1 tablet by mouth every 6 (six) hours as needed for up to 5 days for moderate pain (pain score 4-6). -     pregabalin (LYRICA) 75 MG capsule;  Take 1 capsule (75 mg total) by mouth 2 (two) times daily. -     MR Cervical Spine Wo Contrast; Future   Assessment & Plan Cervical radiculopathy and myelopathy, left side Chronic cervical radiculopathy on the left with significant pain and functional impairment. Symptoms include numbness and tingling radiating down the arm. Differential diagnosis includes nerve compression or degenerative changes. Cervical xray showed degenerative changes.  MRI needed for further evaluation, but claustrophobia is a concern. - Ordered open MRI to evaluate cervical spine. - Switched gabapentin  to Lyrica to reduce sedation and improve pain control. - Prescribed Norco for breakthrough pain. - Will consider referral to orthopedics or sports medicine based on MRI results. - Will provide medication for sedation prior to MRI if needed.  Lateral epicondylitis, left elbow Possible lateral epicondylitis in the left elbow with pain and functional limitations. Differential diagnosis includes nerve-related pain due to cervical radiculopathy.     Percival Glasheen, PA-C

## 2023-12-21 ENCOUNTER — Encounter: Payer: Self-pay | Admitting: Physician Assistant

## 2023-12-21 ENCOUNTER — Ambulatory Visit: Admitting: Physician Assistant

## 2023-12-21 VITALS — BP 137/87 | HR 81 | Temp 97.8°F | Wt 205.0 lb

## 2023-12-21 DIAGNOSIS — Z20828 Contact with and (suspected) exposure to other viral communicable diseases: Secondary | ICD-10-CM | POA: Diagnosis not present

## 2023-12-21 DIAGNOSIS — J329 Chronic sinusitis, unspecified: Secondary | ICD-10-CM

## 2023-12-21 DIAGNOSIS — M654 Radial styloid tenosynovitis [de Quervain]: Secondary | ICD-10-CM | POA: Insufficient documentation

## 2023-12-21 DIAGNOSIS — R6889 Other general symptoms and signs: Secondary | ICD-10-CM | POA: Diagnosis not present

## 2023-12-21 DIAGNOSIS — J40 Bronchitis, not specified as acute or chronic: Secondary | ICD-10-CM

## 2023-12-21 LAB — POC SOFIA 2 FLU + SARS ANTIGEN FIA
Influenza A, POC: NEGATIVE
Influenza B, POC: NEGATIVE
SARS Coronavirus 2 Ag: NEGATIVE

## 2023-12-21 LAB — POCT RAPID STREP A (OFFICE): Rapid Strep A Screen: NEGATIVE

## 2023-12-21 MED ORDER — PREDNISONE 50 MG PO TABS: ORAL_TABLET | ORAL | 0 refills | Status: AC

## 2023-12-21 MED ORDER — AZITHROMYCIN 250 MG PO TABS: ORAL_TABLET | ORAL | 0 refills | Status: AC

## 2023-12-21 NOTE — Progress Notes (Signed)
 Acute Office Visit  Subjective:     Patient ID: Autumn Ortiz, female    DOB: 11-08-1975, 48 y.o.   MRN: [id number]  Chief Complaint  Patient presents with   Cough    Flu like symptoms    HPI .SABRADiscussed the use of AI scribe software for clinical note transcription with the patient, who gave verbal consent to proceed.  History of Present Illness Autumn Ortiz is a 48 year old female who presents with upper respiratory symptoms and wrist pain.  Upper respiratory symptoms - Onset since Thursday - Throat discomfort, congestion, sinus pressure, and ear popping - Fever of 100F last night, resolved since then - No significant body aches - Daughter tested positive for influenza yesterday - Taking Mucinex Cold Sinus Severe and Corcetin for high blood pressure - Able to work from home, concerned about whether she should continue to stay home from work  Wrist pain - Worsening over the past three weeks - Localized pain affecting ability to perform certain movements with thumb - No recollection of specific injury - Wrist is frequently grabbed by her child     ROS See HPI.      Objective:    BP 137/87   Pulse 81   Temp 97.8 F (36.6 C) (Oral)   Wt 205 lb (93 kg)   SpO2 97%   BMI 36.31 kg/m  BP Readings from Last 3 Encounters:  12/21/23 137/87  11/18/23 136/74  07/29/23 (!) 144/93   Wt Readings from Last 3 Encounters:  12/21/23 205 lb (93 kg)  11/18/23 206 lb (93.4 kg)  07/29/23 206 lb (93.4 kg)      Physical Exam Constitutional:      Appearance: Normal appearance. She is obese.  HENT:     Head: Normocephalic.     Comments: Tenderness over maxillary and frontal sinuses to palpation.     Right Ear: Tympanic membrane, ear canal and external ear normal. There is no impacted cerumen.     Left Ear: Tympanic membrane, ear canal and external ear normal. There is no impacted cerumen.     Nose: Congestion present.     Mouth/Throat:     Mouth: Mucous  membranes are moist.     Pharynx: Posterior oropharyngeal erythema present.  Eyes:     Conjunctiva/sclera: Conjunctivae normal.  Cardiovascular:     Rate and Rhythm: Normal rate and regular rhythm.  Pulmonary:     Effort: Pulmonary effort is normal.     Breath sounds: Normal breath sounds.  Musculoskeletal:     Cervical back: Normal range of motion and neck supple. No tenderness.     Right lower leg: No edema.     Left lower leg: No edema.     Comments: Decrease in ROM of right thumb due to pain. No warmth or redness or swelling. Tenderness to palpation over right thumb and into right wrist.  Positive finkelstein on right.   Lymphadenopathy:     Cervical: Cervical adenopathy present.  Neurological:     General: No focal deficit present.     Mental Status: She is alert.  Psychiatric:        Mood and Affect: Mood normal.     Results for orders placed or performed in visit on 12/21/23  POC SOFIA 2 FLU + SARS ANTIGEN FIA  Result Value Ref Range   Influenza A, POC Negative Negative   Influenza B, POC Negative Negative   SARS Coronavirus 2 Ag Negative Negative  POCT rapid strep  A  Result Value Ref Range   Rapid Strep A Screen Negative Negative        Assessment & Plan:   SABRASABRABrandie was seen today for cough.  Diagnoses and all orders for this visit:  Sinobronchitis -     predniSONE  (DELTASONE ) 50 MG tablet; Take one table to for 5 days. -     azithromycin  (ZITHROMAX  Z-PAK) 250 MG tablet; Take 2 tablets (500 mg) on  Day 1,  followed by 1 tablet (250 mg) once daily on Days 2 through 5.  Flu-like symptoms -     POC SOFIA 2 FLU + SARS ANTIGEN FIA -     POCT rapid strep A  Exposure to the flu  De Quervain's tenosynovitis, right -     predniSONE  (DELTASONE ) 50 MG tablet; Take one table to for 5 days.   Assessment & Plan Acute upper respiratory infection with secondary sinobronchitis Negative for flu, COVID, and strep. Likely viral etiology given absence of fever and  normal lung sounds. Symptoms suggest viral upper respiratory infection with secondary infection due to timeline into sinusitis. - Continue Mucinex and Coricidin. - Start prednisone  50mg  once a day and zpack.  - Advised to stay home until fever-free for 24 hours.  De Quervain's tenosynovitis Right thumb pain and difficulty with movement, likely due to tendon inflammation. - Prescribed prednisone  50mg  for 5 days.  - Provided handout on De Quervain's tenosynovitis with exercises.  - Advised icing, resting, and using a brace for the thumb. - Follow up if not improving in 3-4 weeks.     Korinne Greenstein, PA-C

## 2023-12-21 NOTE — Telephone Encounter (Signed)
 Patient scheduled.

## 2023-12-21 NOTE — Patient Instructions (Signed)
 Irritation and Swelling of the Thumb (De Quervain's Syndrome): What to Know  Tommi Rumps Quervain's syndrome causes irritation and swelling of the tendon on the thumb side of the wrist. Tendons are strong tissues that connect muscle to bone.  The tendons in the hand go through a tunnel called a sheath. A slippery layer of tissue helps the tendons move through the sheath. With de Quervain's syndrome, the sheath swells or thickens. This can cause pressure and pain. De Quervain's syndrome is also called de Quervain's disease or de Quervain's tenosynovitis. What are the causes? The exact cause isn't known. It may be from overuse of the hand and wrist. What increases the risk? You're more likely to get [employer] syndrome if: You use your hands far more than normal. This includes if you do certain movements over and over, such as twisting your hand or using a tight grip. You're pregnant. You're female and middle-aged. You have rheumatoid arthritis. You have diabetes. What are the signs or symptoms? The main symptom is pain on the thumb side of your wrist. The pain may get worse when you grasp something or turn your wrist. You may also have: Pain that goes up your forearm. Swelling of your wrist and hand. Trouble moving your thumb and wrist. A feeling of snapping in the wrist. A cyst. This is a pocket of fluid. How is this diagnosed? You may be diagnosed based on your symptoms, medical history, and an exam. The exam may include a Lourena Simmonds test. This is when your health care provider pulls your thumb and wrist to see if it causes pain. You may also have tests. These may include: An X-ray. An MRI. An ultrasound. How is this treated? You may need to: Avoid doing things that: Cause pain or swelling. Cause you to make the same movements with your hand or thumb over and over. Take medicines. These may include: Medicines to help with pain. Steroids to help with irritation and swelling. Wear a  splint. Have surgery. This may be needed if other treatments don't work. Once the pain and swelling have gone down, you may start: Physical therapy. You may be given exercises to help with movement and strength in your wrist and thumb. Occupational therapy. This includes changing how you move your wrist. Follow these instructions at home: If you have a splint: Wear the splint as told. Take it off only if your provider says you can. Check the skin around it every day. Tell your provider if you see problems. Loosen the splint if your fingers tingle, are numb, or turn cold and blue. Keep the splint clean. If the splint isn't waterproof: Do not let it get wet. Cover it when you take a bath or shower. Use a cover that doesn't let any water in. Managing pain, stiffness, and swelling  Use ice or an ice pack as told. If you have a splint that you can take off, remove it only as told. Place a towel between your skin and the ice. Leave the ice on for 20 minutes, 2-3 times a day. If your skin turns red, take off the ice right away to prevent skin damage. The risk of damage is higher if you can't feel pain, heat, or cold. Move your fingers often to reduce stiffness and swelling. Raise your hand above the level of your heart while you're sitting or lying down. Use pillows as needed. General instructions Take your medicines only as told. Ask if it's OK for you to lift. Ask  when it's safe to drive if you have a splint on your hand. Ask what things are safe for you to do at home. Ask when you can go back to work or school. Where to find more information To learn more: Go to orthoinfo.aa[employer].org. Click "Search." Type "De Quervain's tenosynovitis" into the search bar. Contact a health care provider if: Your pain doesn't get better with medicine. Your pain gets worse. You get new symptoms. This information is not intended to replace advice given to you by your health care provider. Make sure you  discuss any questions you have with your health care provider. Document Revised: 08/18/2022 Document Reviewed: 08/18/2022 Elsevier Patient Education  2024 [employer].

## 2024-01-17 ENCOUNTER — Other Ambulatory Visit: Payer: Self-pay | Admitting: Physician Assistant

## 2024-01-17 DIAGNOSIS — I10 Essential (primary) hypertension: Secondary | ICD-10-CM
